# Patient Record
Sex: Male | Born: 1975 | Race: White | Hispanic: No | Marital: Married
Health system: Midwestern US, Community
[De-identification: ages and names within clinical notes are randomized; demographics above are authoritative.]

## PROBLEM LIST (undated history)

## (undated) DIAGNOSIS — E236 Other disorders of pituitary gland: Secondary | ICD-10-CM

## (undated) DIAGNOSIS — Z22322 Carrier or suspected carrier of Methicillin resistant Staphylococcus aureus: Secondary | ICD-10-CM

## (undated) DIAGNOSIS — M549 Dorsalgia, unspecified: Secondary | ICD-10-CM

## (undated) DIAGNOSIS — J189 Pneumonia, unspecified organism: Secondary | ICD-10-CM

## (undated) DIAGNOSIS — D352 Benign neoplasm of pituitary gland: Secondary | ICD-10-CM

## (undated) DIAGNOSIS — E291 Testicular hypofunction: Secondary | ICD-10-CM

## (undated) DIAGNOSIS — D497 Neoplasm of unspecified behavior of endocrine glands and other parts of nervous system: Secondary | ICD-10-CM

## (undated) HISTORY — PX: EYE SURGERY: SHX253

## (undated) HISTORY — PX: OTHER SURGICAL HISTORY: SHX169

## (undated) HISTORY — PX: KNEE SURGERY: SHX244

## (undated) HISTORY — PX: FOOT SURGERY: SHX648

## (undated) HISTORY — PX: BACK SURGERY: SHX140

---

## 2013-08-17 NOTE — Progress Notes (Signed)
HISTORY OF PRESENT ILLNESS  Richard Olsen is a 38 y.o. male.  HPI  Initial visit for hypogonadism - for a year     He takes gel , fortesta 40 mg a day and has not felt good as he says   He says his levels did not go higher   ( it was 208 on labs )  and he stopped using [place] ,  a month ago     He has 53 year old child and a newborn of 9 months old     He has extreme tired ness and loss of libido  He has gained weight quite a bit         Review of Systems   Constitutional: Positive for weight loss and malaise/fatigue.   HENT: Negative.    Eyes: Negative for pain and redness.   Respiratory: Negative.    Cardiovascular: Negative for chest pain, palpitations and leg swelling.   Gastrointestinal: Negative.  Negative for constipation.   Genitourinary: Negative.    Musculoskeletal: Negative for myalgias.   Skin: Negative.    Neurological: Negative.    Endo/Heme/Allergies: Negative.    Psychiatric/Behavioral: Negative for depression and memory loss. The patient does not have insomnia.        Physical Exam   Constitutional: He is oriented to person, place, and time. He appears well-developed and well-nourished.   HENT:   Head: Normocephalic.   Eyes: Conjunctivae and EOM are normal. Pupils are equal, round, and reactive to light.   Neck: Normal range of motion. Neck supple. No JVD present. No tracheal deviation present. No thyromegaly present.   Cardiovascular: Normal rate, regular rhythm and normal heart sounds.    Pulmonary/Chest: Effort normal and breath sounds normal.   Abdominal: Soft. Bowel sounds are normal.   Genitourinary: Penis normal.   Normal genital exam    Musculoskeletal: Normal range of motion.   Lymphadenopathy:     He has no cervical adenopathy.   Neurological: He is alert and oriented to person, place, and time. He has normal reflexes.   Skin: Skin is warm.   Psychiatric: He has a normal mood and affect.       Reviewed labs and scanned       ASSESSMENT and PLAN     1. Hypogonadism : will do complete profile after patient being off for atleast 6 weeks   This seems to be of recent onset as he fathered a child recently  After labs, will do MRI of pit gland   Rule out dys-metabolic syndrome    2. 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

## 2013-08-17 NOTE — Progress Notes (Signed)
Wt Readings from Last 3 Encounters:   08/17/13 281 lb (127.461 kg)     Temp Readings from Last 3 Encounters:   08/17/13 97.6 ??F (36.4 ??C) Oral     BP Readings from Last 3 Encounters:   08/17/13 132/70     Pulse Readings from Last 3 Encounters:   08/17/13 85

## 2013-09-03 LAB — TESTOSTERONE, FREE+TOTAL
Free testosterone (Direct): 4.2 pg/mL — ABNORMAL LOW (ref 8.7–25.1)
TESTOSTERONE, TOTAL, LC/MS: 225.9 ng/dL — ABNORMAL LOW (ref 348.0–1197.0)

## 2013-09-03 LAB — LIPID PANEL
Cholesterol, total: 201 mg/dL — ABNORMAL HIGH (ref 100–199)
HDL Cholesterol: 35 mg/dL — ABNORMAL LOW (ref >39)
Triglyceride: 409 mg/dL — ABNORMAL HIGH (ref 0–149)

## 2013-09-03 LAB — HEMOGLOBIN A1C WITH EAG: Hemoglobin A1c: 6 % — ABNORMAL HIGH (ref 4.8–5.6)

## 2013-09-03 LAB — CVD REPORT

## 2013-09-03 LAB — FOLLICLE STIMULATING HORMONE: FSH: 10.2 m[IU]/mL (ref 1.5–12.4)

## 2013-09-03 LAB — PROLACTIN: Prolactin: 7.7 ng/mL (ref 4.0–15.2)

## 2013-09-03 LAB — LUTEINIZING HORMONE: Luteinizing hormone: 7.2 m[IU]/mL (ref 1.7–8.6)

## 2013-09-03 LAB — TSH 3RD GENERATION: TSH: 1.48 u[IU]/mL (ref 0.450–4.500)

## 2013-09-06 NOTE — Patient Instructions (Addendum)
24 hour urine collection instructions  For free cortisol     On the day you plan to collect urine    1. Discard first urine sample soon after you get up    2. Start collecting urine samples in the container then on whole first day .Marland Kitchen...    3. until the first sample next day is collected into the jar.        Do not skip meals  Do not eat in between meals    Reduce carbs- pasta, rice, potatoes, bread   Do not drink juices or sodas  Donot eat peanut butter     Do not eat sugar free cookies and cakes         Start on fortesta 5 depressions on inner thighs, after the urine and labs ---

## 2013-09-06 NOTE — Progress Notes (Signed)
HISTORY OF PRESENT ILLNESS  Richard Olsen is a 38 y.o. male.  HPI  F/u after first visit for hypogonadism - in 3 weeks     He had labs as requested     He is suspecting of having cushings disease     He claims again that he had rapid weight gain in few months     He also has the [employer] med test         Prior history :    Has hypogonadism for  Year     He takes gel , fortesta 40 mg a day and has not felt good as he says   He says his levels did not go higher   ( it was 208 on labs )  and he stopped using [place] ,  a month ago     He has 12 year old child and a newborn of 54 months old     He has extreme tired ness and loss of libido  He has gained weight quite a bit         Review of Systems   Constitutional: Positive for weight loss and malaise/fatigue.   HENT: Negative.    Eyes: Negative for pain and redness.   Respiratory: Negative.    Cardiovascular: Negative for chest pain, palpitations and leg swelling.   Gastrointestinal: Negative.  Negative for constipation.   Genitourinary: Negative.    Musculoskeletal: Negative for myalgias.   Skin: Negative.    Neurological: Negative.    Endo/Heme/Allergies: Negative.    Psychiatric/Behavioral: Negative for depression and memory loss. The patient does not have insomnia.        Physical Exam   Constitutional: He is oriented to person, place, and time. He appears well-developed and well-nourished.   HENT:   Head: Normocephalic.   Eyes: Conjunctivae and EOM are normal. Pupils are equal, round, and reactive to light.   Neck: Normal range of motion. Neck supple. No JVD present. No tracheal deviation present. No thyromegaly present.   Cardiovascular: Normal rate, regular rhythm and normal heart sounds.    Pulmonary/Chest: Effort normal and breath sounds normal.   Abdominal: Soft. Bowel sounds are normal. very protrubarant belly  Genitourinary: Penis normal.   Normal genital exam    Musculoskeletal: Normal range of motion.   Lymphadenopathy:      He has no cervical adenopathy.   Neurological: He is alert and oriented to person, place, and time. He has normal reflexes.   Skin: Skin is warm.   Psychiatric: He has a normal mood and affect.       Reviewed labs with him      ASSESSMENT and PLAN    1. Hypogonadism : the levels are low   FSH and LH are in mid range   He has lot of [place] which he never used it correctly, asked him to start on  5 depressions a day and educated on proper application  This seems to be of recent onset as he fathered a child recently  will do MRI of pit gland     2.  dys-metabolic syndrome : prediabetes and dyslipidemia   Discussed TLC  Deferred start of any meds     3. On lexapro and wellbutrin for Social Anxiety disorder     Requesting repeat labs in a week, fasting   > 50 % of time is spent on counseling

## 2013-09-06 NOTE — Progress Notes (Signed)
Wt Readings from Last 3 Encounters:   09/06/13 286 lb (129.729 kg)   08/17/13 281 lb (127.461 kg)     Temp Readings from Last 3 Encounters:   09/06/13 97.6 ??F (36.4 ??C) Oral   08/17/13 97.6 ??F (36.4 ??C) Oral     BP Readings from Last 3 Encounters:   09/06/13 110/62   08/17/13 132/70     Pulse Readings from Last 3 Encounters:   09/06/13 80   08/17/13 85

## 2013-09-22 MED ORDER — SODIUM CHLORIDE 0.9 % IJ SYRG
Freq: Once | INTRAMUSCULAR | Status: AC
Start: 2013-09-22 — End: 2013-09-22
  Administered 2013-09-22: 16:00:00 via INTRAVENOUS

## 2013-09-22 MED ORDER — GADOBUTROL 15 MMOL/15 ML (1 MMOL/ML) IV
15 mmol/ mL (1 mmol/mL) | Freq: Once | INTRAVENOUS | Status: AC
Start: 2013-09-22 — End: 2013-09-22
  Administered 2013-09-22: 16:00:00 via INTRAVENOUS

## 2013-09-22 MED ORDER — SODIUM CHLORIDE 0.9% BOLUS IV
0.9 % | Freq: Once | INTRAVENOUS | Status: AC
Start: 2013-09-22 — End: 2013-09-22
  Administered 2013-09-22: 16:00:00 via INTRAVENOUS

## 2013-09-22 MED FILL — GADAVIST 15 MMOL/15 ML (1 MMOL/ML) INTRAVENOUS SOLUTION: 15 mmol/ mL (1 mmol/mL) | INTRAVENOUS | Qty: 15

## 2013-09-22 MED FILL — MONOJECT PREFILL ADVANCED 0.9 % SODIUM CHLORIDE INJECTION SYRINGE: INTRAMUSCULAR | Qty: 10

## 2013-09-22 MED FILL — SODIUM CHLORIDE 0.9 % IV: INTRAVENOUS | Qty: 75

## 2013-09-23 LAB — LUTEINIZING HORMONE: Luteinizing hormone: 4.5 m[IU]/mL (ref 1.7–8.6)

## 2013-09-23 LAB — TESTOSTERONE, FREE & TOTAL
Free testosterone (Direct): 6.3 pg/mL — ABNORMAL LOW (ref 8.7–25.1)
Testosterone: 194 ng/dL — ABNORMAL LOW (ref 348–1197)

## 2013-09-23 LAB — FOLLICLE STIMULATING HORMONE: FSH: 10.1 m[IU]/mL (ref 1.5–12.4)

## 2013-09-23 LAB — CORTISOL, URINE FREE 24 HR

## 2013-09-23 LAB — PSA, DIAGNOSTIC (PROSTATE SPECIFIC AG): Prostate Specific Ag: 1.1 ng/mL (ref 0.0–4.0)

## 2013-09-23 NOTE — Progress Notes (Signed)
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

## 2013-09-30 NOTE — Progress Notes (Signed)
Quick Note:        Left message    ______

## 2013-10-03 MED ORDER — TESTOSTERONE 10 MG/0.5 GRAM/ACTUATION TRANSDERMAL GEL PUMP
10 mg/0.5 gram /actuation | Freq: Every day | TRANSDERMAL | Status: DC
Start: 2013-10-03 — End: 2013-11-11

## 2013-10-03 NOTE — Telephone Encounter (Signed)
Patient says he is returning your call

## 2013-10-03 NOTE — Progress Notes (Signed)
Quick Note:        Informed pt of results. He stated an understanding and states he is already on fortesta. He has enough to last him until his next follow up appointment.    ______

## 2013-10-03 NOTE — Telephone Encounter (Signed)
-----   Message from Poydras sent at 09/30/2013  4:47 PM EDT -----  Regarding: Dr. Vennie Homans  Pt returning nurse call regarding his lab results, best number to reach pt [phone] please call pt this afternoon if possible.

## 2013-10-07 ENCOUNTER — Ambulatory Visit
Admit: 2013-10-07 | Discharge: 2013-10-07 | Payer: PRIVATE HEALTH INSURANCE | Attending: "Endocrinology | Primary: Internal Medicine

## 2013-10-07 DIAGNOSIS — D497 Neoplasm of unspecified behavior of endocrine glands and other parts of nervous system: Secondary | ICD-10-CM

## 2013-10-07 NOTE — Patient Instructions (Signed)
24 hour urine collection instructions    On the day you plan to collect urine    1. Discard first urine sample soon after you get up    2. Start collecting urine samples in the container then on whole first day .....    3. until the first sample next day is collected into the jar.

## 2013-10-07 NOTE — Progress Notes (Signed)
HISTORY OF PRESENT ILLNESS  Richard Olsen is a 38 y.o. male.  HPI  Second F/u after initial  visit for hypogonadism - in 4 weeks       He had MRI done and is here to discuss results     S/p had right foot plantar fasciitis       Prior history     He had labs as requested     He is suspecting of having cushings disease     He claims again that he had rapid weight gain in few months     He also has the [employer] med test         Prior history :    Has hypogonadism for  Year     He takes gel , fortesta 40 mg a day and has not felt good as he says   He says his levels did not go higher   ( it was 208 on labs )  and he stopped using [place] ,  a month ago     He has 9 year old child and a newborn of 21 months old     He has extreme tired ness and loss of libido  He has gained weight quite a bit         Review of Systems   Constitutional: Positive for weight loss and malaise/fatigue.   HENT: Negative.    Eyes: Negative for pain and redness.   Respiratory: Negative.    Cardiovascular: Negative for chest pain, palpitations and leg swelling.   Gastrointestinal: Negative.  Negative for constipation.   Genitourinary: Negative.    Musculoskeletal: Negative for myalgias.   Skin: Negative.    Neurological: Negative.    Endo/Heme/Allergies: Negative.    Psychiatric/Behavioral: Negative for depression and memory loss. The patient does not have insomnia.        Physical Exam   Constitutional: He is oriented to person, place, and time. He appears well-developed and well-nourished.   HENT:   Head: Normocephalic.   Eyes: Conjunctivae and EOM are normal. Pupils are equal, round, and reactive to light.   Neck: Normal range of motion. Neck supple. No JVD present. No tracheal deviation present. No thyromegaly present.   Cardiovascular: Normal rate, regular rhythm and normal heart sounds.    Pulmonary/Chest: Effort normal and breath sounds normal.   Abdominal: Soft. Bowel sounds are normal. very protrubarant belly   Genitourinary: Penis normal.   Normal genital exam    Musculoskeletal: Normal range of motion.   Lymphadenopathy:     He has no cervical adenopathy.   Neurological: He is alert and oriented to person, place, and time. He has normal reflexes.   Skin: Skin is warm.   Psychiatric: He has a normal mood and affect.       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 Hypogonadism :   FSH and LH are in mid range   He is on  5 depressions a day of fortesta   Will do pit labs , mainly for cushings and IGF-1 levels     2.  dys-metabolic syndrome : prediabetes and dyslipidemia   Discussed TLC  a1c 6 %  Will do statin and glucophage      3. 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

## 2013-10-07 NOTE — Progress Notes (Signed)
Wt Readings from Last 3 Encounters:   10/07/13 280 lb (127.007 kg)   09/22/13 267 lb (121.11 kg)   09/06/13 286 lb (129.729 kg)     Temp Readings from Last 3 Encounters:   10/07/13 97.7 ??F (36.5 ??C) Oral   09/06/13 97.6 ??F (36.4 ??C) Oral   08/17/13 97.6 ??F (36.4 ??C) Oral     BP Readings from Last 3 Encounters:   10/07/13 107/73   09/06/13 110/62   08/17/13 132/70     Pulse Readings from Last 3 Encounters:   10/07/13 76   09/06/13 80   08/17/13 85

## 2013-10-10 ENCOUNTER — Encounter: Admit: 2013-10-10 | Discharge: 2013-10-10 | Payer: PRIVATE HEALTH INSURANCE | Primary: Internal Medicine

## 2013-10-10 DIAGNOSIS — E291 Testicular hypofunction: Secondary | ICD-10-CM

## 2013-10-10 MED ORDER — METFORMIN SR 500 MG 24 HR TABLET
500 mg | ORAL_TABLET | Freq: Two times a day (BID) | ORAL | Status: DC
Start: 2013-10-10 — End: 2013-10-24

## 2013-10-15 LAB — METABOLIC PANEL, COMPREHENSIVE
A-G Ratio: 1.9 (ref 1.1–2.5)
ALT (SGPT): 43 IU/L (ref 0–44)
AST (SGOT): 25 IU/L (ref 0–40)
Albumin: 4.5 g/dL (ref 3.5–5.5)
Alk. phosphatase: 81 IU/L (ref 39–117)
BUN/Creatinine ratio: 13 (ref 8–19)
BUN: 11 mg/dL (ref 6–20)
Bilirubin, total: 0.2 mg/dL (ref 0.0–1.2)
CO2: 22 mmol/L (ref 18–29)
Calcium: 9.5 mg/dL (ref 8.7–10.2)
Chloride: 101 mmol/L (ref 97–108)
Creatinine: 0.84 mg/dL (ref 0.76–1.27)
GFR est AA: 130 mL/min/{1.73_m2} (ref >59)
GFR est non-AA: 113 mL/min/{1.73_m2} (ref >59)
GLOBULIN, TOTAL: 2.4 g/dL (ref 1.5–4.5)
Glucose: 92 mg/dL (ref 65–99)
Potassium: 4.2 mmol/L (ref 3.5–5.2)
Protein, total: 6.9 g/dL (ref 6.0–8.5)
Sodium: 141 mmol/L (ref 134–144)

## 2013-10-15 LAB — FOLLICLE STIMULATING HORMONE: FSH: 0.9 m[IU]/mL — ABNORMAL LOW (ref 1.5–12.4)

## 2013-10-15 LAB — LIPID PANEL
Cholesterol, total: 219 mg/dL — ABNORMAL HIGH (ref 100–199)
HDL Cholesterol: 33 mg/dL — ABNORMAL LOW (ref >39)
LDL, calculated: 113 mg/dL — ABNORMAL HIGH (ref 0–99)
Triglyceride: 363 mg/dL — ABNORMAL HIGH (ref 0–149)
VLDL, calculated: 73 mg/dL — ABNORMAL HIGH (ref 5–40)

## 2013-10-15 LAB — CORTISOL, URINE FREE 24 HR
Cortisol free, ug/24 hr: 15 ug/24 hr (ref 0–50)
Cortisol free, ug/L: 13 ug/L

## 2013-10-15 LAB — LUTEINIZING HORMONE: Luteinizing hormone: 0.4 m[IU]/mL — ABNORMAL LOW (ref 1.7–8.6)

## 2013-10-15 LAB — GROWTH HORMONE: Growth Hormone, Serum: <0.1 ng/mL (ref 0.0–10.0)

## 2013-10-15 LAB — T4, FREE: T4, Free: 1.05 ng/dL (ref 0.82–1.77)

## 2013-10-15 LAB — PROLACTIN: Prolactin: 3.7 ng/mL — ABNORMAL LOW (ref 4.0–15.2)

## 2013-10-15 LAB — TESTOSTERONE, FREE+TOTAL
Free testosterone (Direct): 9.3 pg/mL (ref 8.7–25.1)
TESTOSTERONE, TOTAL, LC/MS: 245.9 ng/dL — ABNORMAL LOW (ref 348.0–1197.0)

## 2013-10-15 LAB — ALPHA SUBUNIT (FREE): Alpha Subunit (Free): 0.15 ng/mL

## 2013-10-15 LAB — INSULIN-LIKE GROWTH FACTOR 1: Insulin-Like Growth Factor I: 150 ng/mL (ref 83–233)

## 2013-10-15 LAB — ACTH: ACTH, plasma: 26.9 pg/mL (ref 7.2–63.3)

## 2013-10-15 LAB — HEMOGLOBIN A1C WITH EAG: Hemoglobin A1c: 5.7 % — ABNORMAL HIGH (ref 4.8–5.6)

## 2013-10-15 LAB — TSH 3RD GENERATION: TSH: 0.736 u[IU]/mL (ref 0.450–4.500)

## 2013-10-15 LAB — CORTISOL: Cortisol, random: 11.4 ug/dL (ref 2.3–19.4)

## 2013-10-15 LAB — CVD REPORT

## 2013-10-19 NOTE — Progress Notes (Signed)
Quick Note:        Inform pt that all the labs have come back to be fine but for his TT ( known issue )    ______

## 2013-10-20 NOTE — Progress Notes (Signed)
Quick Note:        Left message    ______

## 2013-10-21 NOTE — Progress Notes (Signed)
Quick Note:        Informed pt of lab results. He stated an understanding    ______

## 2013-10-24 ENCOUNTER — Ambulatory Visit
Admit: 2013-10-24 | Discharge: 2013-10-24 | Payer: PRIVATE HEALTH INSURANCE | Attending: "Endocrinology | Primary: Internal Medicine

## 2013-10-24 DIAGNOSIS — D352 Benign neoplasm of pituitary gland: Secondary | ICD-10-CM

## 2013-10-24 MED ORDER — METFORMIN SR 500 MG 24 HR TABLET
500 mg | ORAL_TABLET | Freq: Every day | ORAL | Status: DC
Start: 2013-10-24 — End: 2014-09-13

## 2013-10-24 MED ORDER — FENOFIBRATE NANOCRYSTALLIZED 48 MG TAB
48 mg | ORAL_TABLET | Freq: Every day | ORAL | Status: DC
Start: 2013-10-24 — End: 2014-09-13

## 2013-10-24 NOTE — Progress Notes (Signed)
HISTORY OF PRESENT ILLNESS  Richard Olsen is a 38 y.o. male.  HPI  Second F/u after last  visit for pre diabetes , pituitary cyst and   hypogonadism - oct 2015      He had labs , fasting done again per my request   S/p had right foot plantar fasciitis     Gained 5 lbs ( 8 lbs to 10 lbs for uniform)  May be he lost 3- 5 lbs         Prior history     He had labs as requested     He is suspecting of having cushings disease     He claims again that he had rapid weight gain in few months     He also has the [employer] med test         Prior history :    Has hypogonadism for  Year     He takes gel , fortesta 40 mg a day and has not felt good as he says   He says his levels did not go higher   ( it was 208 on labs )  and he stopped using [place] ,  a month ago     He has 69 year old child and a newborn of 62 months old     He has extreme tired ness and loss of libido  He has gained weight quite a bit         Review of Systems   Constitutional: fatigue    HENT: Negative.    Eyes: Negative for pain and redness.   Respiratory: Negative.    Cardiovascular: Negative for chest pain, palpitations and leg swelling.   Gastrointestinal: Negative.  Negative for constipation.   Genitourinary: Negative.    Musculoskeletal: Negative for myalgias.   Skin: Negative.    Neurological: Negative.    Endo/Heme/Allergies: Negative.    Psychiatric/Behavioral: Negative for depression and memory loss. The patient does not have insomnia.        Physical Exam   Constitutional: He is oriented to person, place, and time. He appears well-developed and well-nourished.   HENT:   Head: Normocephalic.   Eyes: Conjunctivae and EOM are normal. Pupils are equal, round, and reactive to light.   Neck: Normal range of motion. Neck supple. No JVD present. No tracheal deviation present. No thyromegaly present.   Cardiovascular: Normal rate, regular rhythm and normal heart sounds.    Pulmonary/Chest: Effort normal and breath sounds normal.    Abdominal: Soft. Bowel sounds are normal. very protrubarant belly  Genitourinary: Penis normal.   Normal genital exam    Musculoskeletal: Normal range of motion.   Lymphadenopathy:     He has no cervical adenopathy.   Neurological: He is alert and oriented to person, place, and time. He has normal reflexes.   Skin: Skin is warm.   Psychiatric: He has a normal mood and affect.       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 Hypogonadism :   FSH and LH are in mid range   He is on  5 depressions a day of fortesta     2. Obesity  : Body mass index is 46.02 kg/(m^2).  Ruled out central cushings and growth hormone deficiency by labs   Pending salivary cortisol levels - 2 collections  Recommending him FDA approved weight loss meds   Or going in for bariatric surgery    3.  dys-metabolic syndrome : prediabetes and dyslipidemia   Discussed TLC  a1c is 5.7 %  Compared to  6 %  He got metformin ER 500 mg bid andhe is noticing some weight loss   By ACC/AHA calculator, he does not require the statin  Start on fibrate -tricor 54 mg     4. On lexapro and wellbutrin for Social Anxiety disorder     5. Sleep apnoea - on cpap    Requesting repeat labs in a week, fasting   > 50 % of time is spent on counseling

## 2013-10-24 NOTE — Patient Instructions (Addendum)
Increase metformin ER to 500 mg  4 pills a day with food       Start on Tricor 48 mg a day at night       continue on  Fortesta 5 pumps a day        Please collect salivary cortisol - 11 PM  ( random ) 2 times

## 2013-10-24 NOTE — Progress Notes (Signed)
Wt Readings from Last 3 Encounters:   10/24/13 285 lb (129.275 kg)   10/07/13 280 lb (127.007 kg)   09/22/13 267 lb (121.11 kg)     Temp Readings from Last 3 Encounters:   10/24/13 97.9 ??F (36.6 ??C) Oral   10/07/13 97.7 ??F (36.5 ??C) Oral   09/06/13 97.6 ??F (36.4 ??C) Oral     BP Readings from Last 3 Encounters:   10/24/13 130/66   10/07/13 107/73   09/06/13 110/62     Pulse Readings from Last 3 Encounters:   10/24/13 83   10/07/13 76   09/06/13 80

## 2013-11-08 ENCOUNTER — Encounter: Attending: "Endocrinology | Primary: Internal Medicine

## 2013-11-11 ENCOUNTER — Encounter

## 2013-11-11 MED ORDER — TESTOSTERONE 10 MG/0.5 GRAM/ACTUATION TRANSDERMAL GEL PUMP
10 mg/0.5 gram /actuation | Freq: Every day | TRANSDERMAL | Status: DC
Start: 2013-11-11 — End: 2014-09-13

## 2013-11-22 NOTE — Progress Notes (Signed)
Discussed with Dr. Richardo Priest at [phone] , [place]  Regarding patient's medical condition who has been working on patient's disability for back reasons

## 2014-01-13 ENCOUNTER — Encounter: Primary: Internal Medicine

## 2014-01-13 ENCOUNTER — Encounter: Admit: 2014-01-13 | Discharge: 2014-01-13 | Payer: PRIVATE HEALTH INSURANCE | Primary: Internal Medicine

## 2014-01-17 LAB — TESTOSTERONE, FREE+TOTAL
Free testosterone (Direct): 15.7 pg/mL (ref 8.7–25.1)
TESTOSTERONE, TOTAL, LC/MS: 426.3 ng/dL (ref 348.0–1197.0)

## 2014-01-17 LAB — CBC WITH AUTOMATED DIFF
ABS. BASOPHILS: 0.1 10*3/uL (ref 0.0–0.2)
ABS. EOSINOPHILS: 0.3 10*3/uL (ref 0.0–0.4)
ABS. IMM. GRANS.: 0 10*3/uL (ref 0.0–0.1)
ABS. MONOCYTES: 0.8 10*3/uL (ref 0.1–0.9)
ABS. NEUTROPHILS: 4.2 10*3/uL (ref 1.4–7.0)
Abs Lymphocytes: 3.3 10*3/uL — ABNORMAL HIGH (ref 0.7–3.1)
BASOPHILS: 1 %
EOSINOPHILS: 4 %
HCT: 45.4 % (ref 37.5–51.0)
HGB: 15.1 g/dL (ref 12.6–17.7)
IMMATURE GRANULOCYTES: 0 %
Lymphocytes: 38 %
MCH: 28.8 pg (ref 26.6–33.0)
MCHC: 33.3 g/dL (ref 31.5–35.7)
MCV: 87 fL (ref 79–97)
MONOCYTES: 10 %
NEUTROPHILS: 47 %
PLATELET: 274 10*3/uL (ref 150–379)
RBC: 5.24 x10E6/uL (ref 4.14–5.80)
RDW: 13.8 % (ref 12.3–15.4)
WBC: 8.8 10*3/uL (ref 3.4–10.8)

## 2014-01-17 LAB — HEMOGLOBIN A1C WITH EAG
Estimated average glucose: 120 mg/dL
Hemoglobin A1c: 5.8 % — ABNORMAL HIGH (ref 4.8–5.6)

## 2014-01-17 LAB — LIPID PANEL
Cholesterol, total: 205 mg/dL — ABNORMAL HIGH (ref 100–199)
HDL Cholesterol: 39 mg/dL — ABNORMAL LOW (ref >39)
LDL, calculated: 110 mg/dL — ABNORMAL HIGH (ref 0–99)
Triglyceride: 280 mg/dL — ABNORMAL HIGH (ref 0–149)
VLDL, calculated: 56 mg/dL — ABNORMAL HIGH (ref 5–40)

## 2014-01-17 LAB — FOLLICLE STIMULATING HORMONE: FSH: 2 m[IU]/mL (ref 1.5–12.4)

## 2014-01-17 LAB — CVD REPORT

## 2014-01-17 LAB — LUTEINIZING HORMONE: Luteinizing hormone: 0.3 m[IU]/mL — ABNORMAL LOW (ref 1.7–8.6)

## 2014-01-20 ENCOUNTER — Encounter: Attending: "Endocrinology | Primary: Internal Medicine

## 2014-01-27 ENCOUNTER — Encounter: Primary: Internal Medicine

## 2014-08-22 ENCOUNTER — Encounter

## 2014-08-22 ENCOUNTER — Ambulatory Visit
Admit: 2014-08-23 | Discharge: 2014-08-23 | Payer: PRIVATE HEALTH INSURANCE | Attending: "Endocrinology | Primary: Internal Medicine

## 2014-08-22 DIAGNOSIS — D497 Neoplasm of unspecified behavior of endocrine glands and other parts of nervous system: Secondary | ICD-10-CM

## 2014-08-22 NOTE — Patient Instructions (Addendum)
Do not give any prescriptions today - Aug 22 2014     metformin ER to 500 mg  4 pills a day with food       Stay on Tricor 48 mg a day at night       Testosterone  gel 5 depressions a day

## 2014-08-22 NOTE — Progress Notes (Signed)
HISTORY OF PRESENT ILLNESS  Richard Olsen is a 39 y.o. male.  HPI   F/u after last  visit for pre diabetes , pituitary cyst and   hypogonadism - oct 2015    He had referral issues     He has 2 kids with him     Lost 17  lbs ( not in Uniform )    He mentions that his pcp took him off tricor as well as metformin saying he does not need either   He has been off TT       Prior history     He had labs as requested     He is suspecting of having cushings disease     He claims again that he had rapid weight gain in few months     He also has the [employer] med test         Prior history :    Has hypogonadism for  Year     He takes gel , fortesta 40 mg a day and has not felt good as he says   He says his levels did not go higher   ( it was 208 on labs )  and he stopped using [place] ,  a month ago     He has 39 year old child and a newborn of 44 months old     He has extreme tired ness and loss of libido  He has gained weight quite a bit         Review of Systems   Constitutional: fatigue    HENT: Negative.    Eyes: Negative for pain and redness.   Respiratory: Negative.    Cardiovascular: Negative for chest pain, palpitations and leg swelling.   Gastrointestinal: Negative.  Negative for constipation.   Genitourinary: Negative.    Musculoskeletal: Negative for myalgias.   Skin: Negative.    Neurological: Negative.    Endo/Heme/Allergies: Negative.    Psychiatric/Behavioral: Negative for depression and memory loss. The patient does not have insomnia.        Physical Exam   Constitutional: He is oriented to person, place, and time. He appears well-developed and well-nourished.   HENT:   Head: Normocephalic.   Eyes: Conjunctivae and EOM are normal. Pupils are equal, round, and reactive to light.   Neck: Normal range of motion. Neck supple. No JVD present. No tracheal deviation present. No thyromegaly present.   Cardiovascular: Normal rate, regular rhythm and normal heart sounds.     Pulmonary/Chest: Effort normal and breath sounds normal.   Abdominal: Soft. Bowel sounds are normal. very protrubarant belly  Genitourinary: Penis normal.   Normal genital exam    Musculoskeletal: Normal range of motion.   Lymphadenopathy:     He has no cervical adenopathy.   Neurological: He is alert and oriented to person, place, and time. He has normal reflexes.   Skin: Skin is warm.   Psychiatric: He has a normal mood and affect.       Labs to be obtained from April 2016       ASSESSMENT and PLAN    1. Pituitary tumor /cyst :  8 mm posterior cyst from MRI sept 2015   Will do a repeat sept 2016     A. Hypogonadism :   FSH and LH are in mid range   He is off    fortesta since feb 2016     2. Obesity  : Body mass index is  43.26 kg/(m^2).  Ruled out central cushings and growth hormone deficiency by labs   salivary cortisol levels - 2 collections     Recommending him FDA approved weight loss meds   Or going in for bariatric surgery    3.  dys-metabolic syndrome : prediabetes and dyslipidemia   Discussed TLC  a1c is 5.8 %  Compared to  6 %   He got metformin ER 500 mg bid and he is noticing some weight loss   By ACC/AHA calculator, he does not require the statin  Started on fibrate -tricor 54 mg in nov 2015   He has been off TRICOR as recommended by his pcp and await all labs he had from April 2016     4. On lunesta  and wellbutrin for Social Anxiety disorder , ADHD and PTSD    5. Sleep apnoea - on cpap  I do nto want him to start on any meds until April 2016 labs are seen and compared to jan 2016 labs   Requesting repeat labs in a week, fasting   > 50 % of time is spent on counseling

## 2014-08-22 NOTE — Progress Notes (Signed)
Wt Readings from Last 3 Encounters:   08/22/14 268 lb (121.6 kg)   10/24/13 285 lb (129.3 kg)   10/07/13 280 lb (127 kg)     Temp Readings from Last 3 Encounters:   08/22/14 97.2 ??F (36.2 ??C) (Oral)   10/24/13 97.9 ??F (36.6 ??C) (Oral)   10/07/13 97.7 ??F (36.5 ??C) (Oral)     BP Readings from Last 3 Encounters:   08/22/14 103/65   10/24/13 130/66   10/07/13 107/73     Pulse Readings from Last 3 Encounters:   08/22/14 80   10/24/13 83   10/07/13 76     Lab Results   Component Value Date/Time    HEMOGLOBIN A1C 5.8 01/13/2014 01:25 PM

## 2014-09-05 ENCOUNTER — Inpatient Hospital Stay: Admit: 2014-09-05 | Payer: TRICARE (CHAMPUS) | Attending: "Endocrinology | Primary: Internal Medicine

## 2014-09-05 DIAGNOSIS — D497 Neoplasm of unspecified behavior of endocrine glands and other parts of nervous system: Secondary | ICD-10-CM

## 2014-09-05 MED ORDER — GADOBUTROL 10 MMOL/10 ML (1 MMOL/ML) IV
10 mmol/ mL (1 mmol/mL) | Freq: Once | INTRAVENOUS | Status: AC
Start: 2014-09-05 — End: 2014-09-05
  Administered 2014-09-05: 17:00:00 via INTRAVENOUS

## 2014-09-05 MED FILL — GADOBUTROL 10 MMOL/10 ML (1 MMOL/ML) IV: 10 mmol/ mL (1 mmol/mL) | INTRAVENOUS | Qty: 20

## 2014-09-05 NOTE — Progress Notes (Signed)
Informed pt of MRI results. He stated an understanding

## 2014-09-07 NOTE — Progress Notes (Signed)
Inform pt that the pituitary tumor reached a size of 1 cm , almost same size as before     Will talk about the care at next visit

## 2014-09-13 ENCOUNTER — Encounter

## 2014-09-13 MED ORDER — FENOFIBRATE NANOCRYSTALLIZED 48 MG TAB
48 mg | ORAL_TABLET | Freq: Every day | ORAL | 6 refills | Status: DC
Start: 2014-09-13 — End: 2015-05-11

## 2014-09-13 MED ORDER — TESTOSTERONE 10 MG/0.5 GRAM/ACTUATION TRANSDERMAL GEL PUMP
10 mg/0.5 gram /actuation | Freq: Every day | TRANSDERMAL | 6 refills | Status: DC
Start: 2014-09-13 — End: 2014-09-26

## 2014-09-13 MED ORDER — METFORMIN SR 500 MG 24 HR TABLET
500 mg | ORAL_TABLET | Freq: Every day | ORAL | 6 refills | Status: DC
Start: 2014-09-13 — End: 2014-09-26

## 2014-09-26 ENCOUNTER — Ambulatory Visit
Admit: 2014-09-26 | Discharge: 2014-09-26 | Payer: PRIVATE HEALTH INSURANCE | Attending: "Endocrinology | Primary: Internal Medicine

## 2014-09-26 ENCOUNTER — Encounter

## 2014-09-26 DIAGNOSIS — D497 Neoplasm of unspecified behavior of endocrine glands and other parts of nervous system: Secondary | ICD-10-CM

## 2014-09-26 MED ORDER — TESTOSTERONE 10 MG/0.5 GRAM/ACTUATION TRANSDERMAL GEL PUMP
10 mg/0.5 gram /actuation | Freq: Every day | TRANSDERMAL | 6 refills | Status: DC
Start: 2014-09-26 — End: 2014-11-14

## 2014-09-26 MED ORDER — METFORMIN SR 500 MG 24 HR TABLET
500 mg | ORAL_TABLET | Freq: Two times a day (BID) | ORAL | 6 refills | Status: DC
Start: 2014-09-26 — End: 2015-05-11

## 2014-09-26 NOTE — Patient Instructions (Addendum)
metformin ER to 500 mg 2  Pill  Twice a day with food       Stay on Tricor 48 mg a day at night       Decrease Testosterone  gel 4 depressions a day

## 2014-09-26 NOTE — Progress Notes (Signed)
HISTORY OF PRESENT ILLNESS  Richard Olsen is a 39 y.o. male.  HPI   F/u after last  visit for pre diabetes , pituitary cyst and   hypogonadism - aug 2016       He got started on meds incl TT     Gained 18 lbs ( exactly he lost 17 lbs since stopping TT )  He is taking 6 depressions a day instead of 5 written out by me      He is c/o diarrhea from metformin ER   His BP went up higher too   He mentions that his pcp and him had mis communication and he is back on to meds     Prior history     He had labs as requested     He is suspecting of having cushings disease     He claims again that he had rapid weight gain in few months     He also has the [employer] med test         Prior history :    Has hypogonadism for  Year     He takes gel , fortesta 40 mg a day and has not felt good as he says   He says his levels did not go higher   ( it was 208 on labs )  and he stopped using [place] ,  a month ago     He has 63 year old child and a newborn of 65 months old     He has extreme tired ness and loss of libido  He has gained weight quite a bit         Review of Systems   Constitutional: fatigue    HENT: Negative.    Eyes: Negative for pain and redness.   Respiratory: Negative.    Cardiovascular: Negative for chest pain, palpitations and leg swelling.   Gastrointestinal: Negative.  Negative for constipation.   Genitourinary: Negative.    Musculoskeletal: Negative for myalgias.   Skin: Negative.    Neurological: Negative.    Endo/Heme/Allergies: Negative.    Psychiatric/Behavioral: Negative for depression and memory loss. The patient does not have insomnia.        Physical Exam   Constitutional: He is oriented to person, place, and time. He appears well-developed and well-nourished.   HENT:   Head: Normocephalic.   Eyes: Conjunctivae and EOM are normal. Pupils are equal, round, and reactive to light.   Neck: Normal range of motion. Neck supple. No JVD present. No tracheal deviation present. No thyromegaly present.    Cardiovascular: Normal rate, regular rhythm and normal heart sounds.    Pulmonary/Chest: Effort normal and breath sounds normal.   Abdominal: Soft. Bowel sounds are normal. very protrubarant belly  Genitourinary: Penis normal.   Normal genital exam    Musculoskeletal: Normal range of motion.   Lymphadenopathy:     He has no cervical adenopathy.   Neurological: He is alert and oriented to person, place, and time. He has normal reflexes.   Skin: Skin is warm.   Psychiatric: He has a normal mood and affect.       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 Hypogonadism :   FSH and LH are in mid range   He is off    fortesta since feb 2016 and resumed it in aug 2016   Within a month of starting it , gained 17 lbs   Will reduce the dose to 4 depressions a day as he took 6 depressions a day on his own despite written instructions       2. Obesity  : Body mass index is 44.87 kg/(m^2).  Ruled out central cushings and growth hormone deficiency by labs   salivary cortisol levels - 2 collections - normal   Educated him about weight gain from TT use     3.  dys-metabolic syndrome : prediabetes and dyslipidemia   Discussed TLC  a1c is 5.8 %  Compared to  6 %   He got metformin ER 500 mg bid and he is noticing some weight loss   By ACC/AHA calculator, he does not require the statin  Started on fibrate -tricor 54 mg in nov 2015 ,   He has been off [employer] gap with  his pcp and resumed it back  177, 224,  31, 101   He resumed TRICOR     4. On lunesta  and wellbutrin for Social Anxiety disorder , ADHD and PTSD  He is staying on lexapro and discontinued on welbutrin   lexapro - appetite stimulating   Asking him to go to welbutrin being weight neutral if need be     5. Sleep apnoea - on cpap      > 50 % of time is spent on counseling

## 2014-09-26 NOTE — Progress Notes (Signed)
Wt Readings from Last 3 Encounters:   09/26/14 278 lb (126.1 kg)   09/05/14 260 lb (117.9 kg)   08/22/14 268 lb (121.6 kg)     Temp Readings from Last 3 Encounters:   09/26/14 97.4 ??F (36.3 ??C) (Oral)   08/22/14 97.2 ??F (36.2 ??C) (Oral)   10/24/13 97.9 ??F (36.6 ??C) (Oral)     BP Readings from Last 3 Encounters:   09/26/14 125/89   08/22/14 103/65   10/24/13 130/66     Pulse Readings from Last 3 Encounters:   09/26/14 74   08/22/14 80   10/24/13 83

## 2014-10-06 ENCOUNTER — Encounter: Admit: 2014-10-06 | Discharge: 2014-10-06 | Payer: PRIVATE HEALTH INSURANCE | Primary: Internal Medicine

## 2014-10-06 NOTE — Addendum Note (Signed)
Addended by: Jeanette Caprice on: 10/06/2014 08:30 AM      Modules accepted: Orders

## 2014-10-12 LAB — CBC WITH AUTOMATED DIFF
ABS. BASOPHILS: 0.1 10*3/uL (ref 0.0–0.2)
ABS. EOSINOPHILS: 0.4 10*3/uL (ref 0.0–0.4)
ABS. IMM. GRANS.: 0 10*3/uL (ref 0.0–0.1)
ABS. MONOCYTES: 0.7 10*3/uL (ref 0.1–0.9)
ABS. NEUTROPHILS: 3.8 10*3/uL (ref 1.4–7.0)
Abs Lymphocytes: 2.7 10*3/uL (ref 0.7–3.1)
BASOPHILS: 1 %
EOSINOPHILS: 5 %
HCT: 42.2 % (ref 37.5–51.0)
HGB: 14.4 g/dL (ref 12.6–17.7)
IMMATURE GRANULOCYTES: 1 %
Lymphocytes: 35 %
MCH: 29 pg (ref 26.6–33.0)
MCHC: 34.1 g/dL (ref 31.5–35.7)
MCV: 85 fL (ref 79–97)
MONOCYTES: 9 %
NEUTROPHILS: 49 %
PLATELET: 231 10*3/uL (ref 150–379)
RBC: 4.97 x10E6/uL (ref 4.14–5.80)
RDW: 14.4 % (ref 12.3–15.4)
WBC: 7.6 10*3/uL (ref 3.4–10.8)

## 2014-10-12 LAB — LUTEINIZING HORMONE: Luteinizing hormone: 3.2 m[IU]/mL (ref 1.7–8.6)

## 2014-10-12 LAB — TESTOSTERONE, FREE+TOTAL
Free testosterone (Direct): 6.9 pg/mL — ABNORMAL LOW (ref 8.7–25.1)
TESTOSTERONE, TOTAL, LC/MS: 249.8 ng/dL — ABNORMAL LOW (ref 348.0–1197.0)

## 2014-10-12 LAB — FOLLICLE STIMULATING HORMONE: FSH: 7.6 m[IU]/mL (ref 1.5–12.4)

## 2014-10-12 LAB — PSA, DIAGNOSTIC (PROSTATE SPECIFIC AG): Prostate Specific Ag: 0.7 ng/mL (ref 0.0–4.0)

## 2014-11-13 ENCOUNTER — Telehealth

## 2014-11-13 NOTE — Telephone Encounter (Signed)
Patient needs Testosterone RX sent to [facility]  [facility]

## 2014-11-14 MED ORDER — TESTOSTERONE 10 MG/0.5 GRAM/ACTUATION TRANSDERMAL GEL PUMP
10 mg/0.5 gram /actuation | Freq: Every day | TRANSDERMAL | 5 refills | Status: DC
Start: 2014-11-14 — End: 2015-01-26

## 2015-01-09 ENCOUNTER — Telehealth

## 2015-01-09 NOTE — Telephone Encounter (Signed)
-----   Message from Estella Husk, MD sent at 01/05/2015  9:16 PM EST -----  Cbc with diff, total and free tt, psa  And FSH and LH, lipid profile       ----- Message -----     From: Minette Headland, LPN     Sent: [id number]  12:26 PM       To: Estella Husk, MD    Which labs to be drawn ??

## 2015-01-09 NOTE — Telephone Encounter (Signed)
Order placed for pt,  per Verbal Order with read back from Dr Shellia Cleverly on 01/09/2015.

## 2015-01-12 ENCOUNTER — Encounter: Admit: 2015-01-12 | Discharge: 2015-01-12 | Payer: PRIVATE HEALTH INSURANCE | Primary: Internal Medicine

## 2015-01-12 DIAGNOSIS — E291 Testicular hypofunction: Secondary | ICD-10-CM

## 2015-01-15 ENCOUNTER — Encounter: Attending: "Endocrinology | Primary: Internal Medicine

## 2015-01-16 LAB — LIPID PANEL
Cholesterol, total: 197 mg/dL (ref 100–199)
HDL Cholesterol: 36 mg/dL — ABNORMAL LOW (ref >39)
LDL, calculated: 122 mg/dL — ABNORMAL HIGH (ref 0–99)
Triglyceride: 193 mg/dL — ABNORMAL HIGH (ref 0–149)
VLDL, calculated: 39 mg/dL (ref 5–40)

## 2015-01-16 LAB — CBC WITH AUTOMATED DIFF
ABS. BASOPHILS: 0.1 10*3/uL (ref 0.0–0.2)
ABS. EOSINOPHILS: 0.3 10*3/uL (ref 0.0–0.4)
ABS. IMM. GRANS.: 0 10*3/uL (ref 0.0–0.1)
ABS. MONOCYTES: 0.8 10*3/uL (ref 0.1–0.9)
ABS. NEUTROPHILS: 4.4 10*3/uL (ref 1.4–7.0)
Abs Lymphocytes: 3 10*3/uL (ref 0.7–3.1)
BASOPHILS: 1 %
EOSINOPHILS: 3 %
HCT: 43.2 % (ref 37.5–51.0)
HGB: 14.9 g/dL (ref 12.6–17.7)
IMMATURE GRANULOCYTES: 1 %
Lymphocytes: 35 %
MCH: 28.8 pg (ref 26.6–33.0)
MCHC: 34.5 g/dL (ref 31.5–35.7)
MCV: 84 fL (ref 79–97)
MONOCYTES: 9 %
NEUTROPHILS: 51 %
PLATELET: 254 10*3/uL (ref 150–379)
RBC: 5.17 x10E6/uL (ref 4.14–5.80)
RDW: 13.8 % (ref 12.3–15.4)
WBC: 8.5 10*3/uL (ref 3.4–10.8)

## 2015-01-16 LAB — TESTOSTERONE, FREE+TOTAL
Free testosterone (Direct): 9.4 pg/mL (ref 8.7–25.1)
TESTOSTERONE, TOTAL, LC/MS: 288.4 ng/dL — ABNORMAL LOW (ref 348.0–1197.0)

## 2015-01-16 LAB — PSA, DIAGNOSTIC (PROSTATE SPECIFIC AG): Prostate Specific Ag: 0.9 ng/mL (ref 0.0–4.0)

## 2015-01-16 LAB — FOLLICLE STIMULATING HORMONE: FSH: 7 m[IU]/mL (ref 1.5–12.4)

## 2015-01-16 LAB — LUTEINIZING HORMONE: Luteinizing hormone: 4.9 m[IU]/mL (ref 1.7–8.6)

## 2015-01-16 LAB — CVD REPORT

## 2015-01-26 ENCOUNTER — Ambulatory Visit
Admit: 2015-01-26 | Discharge: 2015-01-26 | Payer: PRIVATE HEALTH INSURANCE | Attending: "Endocrinology | Primary: Internal Medicine

## 2015-01-26 ENCOUNTER — Encounter

## 2015-01-26 DIAGNOSIS — E291 Testicular hypofunction: Secondary | ICD-10-CM

## 2015-01-26 MED ORDER — BUPROPION XL 150 MG 24 HR TAB
150 mg | ORAL_TABLET | Freq: Every evening | ORAL | 5 refills | Status: DC
Start: 2015-01-26 — End: 2015-05-11

## 2015-01-26 MED ORDER — TESTOSTERONE 10 MG/0.5 GRAM/ACTUATION TRANSDERMAL GEL PUMP
10 mg/0.5 gram /actuation | Freq: Every day | TRANSDERMAL | 5 refills | Status: DC
Start: 2015-01-26 — End: 2015-05-11

## 2015-01-26 NOTE — Progress Notes (Signed)
Wt Readings from Last 3 Encounters:   01/26/15 291 lb (132 kg)   09/26/14 278 lb (126.1 kg)   09/05/14 260 lb (117.9 kg)     Temp Readings from Last 3 Encounters:   01/26/15 97.1 ??F (36.2 ??C) (Oral)   09/26/14 97.4 ??F (36.3 ??C) (Oral)   08/22/14 97.2 ??F (36.2 ??C) (Oral)     BP Readings from Last 3 Encounters:   01/26/15 (!) 122/91   09/26/14 125/89   08/22/14 103/65     Pulse Readings from Last 3 Encounters:   01/26/15 80   09/26/14 74   08/22/14 80     Lab Results   Component Value Date/Time    Hemoglobin A1c 5.8 01/13/2014 01:25 PM

## 2015-01-26 NOTE — Progress Notes (Signed)
HISTORY OF PRESENT ILLNESS  Richard Olsen is a 40 y.o. male.  HPI   F/u after last  visit for pre diabetes , pituitary hypogonadism - Oct 2016       Gained  13 lbs ( oct 2016 )  He says he is not taking TT as he felt the rages at home   He is taking it intermittently    He stopped all his anti anxiety/anti depressant meds too   He quit smoking he says 46 days ago    Prior history     He had labs as requested     He is suspecting of having cushings disease     He claims again that he had rapid weight gain in few months     He also has the [employer] med test         Prior history :    Has hypogonadism for  Year     He takes gel , fortesta 40 mg a day and has not felt good as he says   He says his levels did not go higher   ( it was 208 on labs )  and he stopped using [place] ,  a month ago     He has 71 year old child and a newborn of 48 months old     He has extreme tired ness and loss of libido  He has gained weight quite a bit         Review of Systems   Constitutional: fatigue    HENT: Negative.    Eyes: Negative for pain and redness.   Respiratory: Negative.    Cardiovascular: Negative for chest pain, palpitations and leg swelling.   Gastrointestinal: Negative.  Negative for constipation.   Genitourinary: Negative.    Musculoskeletal: Negative for myalgias.   Skin: Negative.    Neurological: Negative.    Endo/Heme/Allergies: Negative.    Psychiatric/Behavioral: Negative for depression and memory loss. The patient does not have insomnia.        Physical Exam   Constitutional: He is oriented to person, place, and time. He appears well-developed and well-nourished.   HENT:   Head: Normocephalic.   Eyes: Conjunctivae and EOM are normal. Pupils are equal, round, and reactive to light.   Neck: Normal range of motion. Neck supple. No JVD present. No tracheal deviation present. No thyromegaly present.   Cardiovascular: Normal rate, regular rhythm and normal heart sounds.     Pulmonary/Chest: Effort normal and breath sounds normal.   Abdominal: Soft. Bowel sounds are normal. very protrubarant belly  Genitourinary: Penis normal.   Normal genital exam    Musculoskeletal: Normal range of motion.   Lymphadenopathy:     He has no cervical adenopathy.   Neurological: He is alert and oriented to person, place, and time. He has normal reflexes.   Skin: Skin is warm.   Psychiatric: He has a normal mood and affect.       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 Hypogonadism :   FSH and LH are in mid range   He is off    fortesta since feb 2016 and resumed it in aug 2016   Within  a month of starting it , gained 17 lbs and again another 13 lbs of weight gain in oct 2016   reduced  the dose to 4 depressions a day as he took 6 depressions a day on his own despite written instructions   Asking him to not use it intermittently   Explained the rationale behind his weight gain and rages   Asked him to  cut down to 2 depressions a day   TT is an optional medication, so there is no compulsion to be on it       2. Obesity  : Body mass index is 46.97 kg/(m^2).  Ruled out central cushings and growth hormone deficiency by labs   salivary cortisol levels - 2 collections - normal   Educated him about weight gain from TT use at higher doses   Will keep him on low doses,     3.  dys-metabolic syndrome : prediabetes and dyslipidemia   Discussed TLC  a1c is 5.8 %  Compared to  6 %   He got metformin ER 500 mg 4 pills  bid   By ACC/AHA calculator, he does not require the statin  on fibrate -tricor 48 mg - TG are good       Obesity : Body mass index is 46.97 kg/(m^2).  Gained 30 lbs  Since starting TT  Intermittent use, inadervertant use are the causes for weight gain   Along with quitting smoking  Hoping he will more cautious in its use       4. Off lunesta  and wellbutrin for Social Anxiety disorder , ADHD and PTSD  He is staying on lexapro and discontinued on welbutrin   lexapro - appetite stimulating and he says he is off all meds   Asking him to go to welbutrin for few months, before deciding on change of meds from new psychiartrist     5. Sleep apnoea - on cpap    6. Quit smoking on chantix - 46 days ago       > 50 % of time is spent on counseling

## 2015-01-26 NOTE — Patient Instructions (Signed)
Start on wellbutrin xr 150 mg at night     metformin ER to 500 mg 2  Pill  Twice a day with food       Stay on Tricor 48 mg a day at night       Decrease Testosterone  gel 2 depressions a day

## 2015-01-27 DIAGNOSIS — E291 Testicular hypofunction: Secondary | ICD-10-CM

## 2015-04-27 ENCOUNTER — Encounter

## 2015-05-04 ENCOUNTER — Encounter: Admit: 2015-05-04 | Discharge: 2015-05-04 | Payer: PRIVATE HEALTH INSURANCE | Primary: Internal Medicine

## 2015-05-04 DIAGNOSIS — E291 Testicular hypofunction: Secondary | ICD-10-CM

## 2015-05-06 LAB — CBC WITH AUTOMATED DIFF
ABS. BASOPHILS: 0.1 10*3/uL (ref 0.0–0.2)
ABS. EOSINOPHILS: 0.2 10*3/uL (ref 0.0–0.4)
ABS. IMM. GRANS.: 0.1 10*3/uL (ref 0.0–0.1)
ABS. MONOCYTES: 0.6 10*3/uL (ref 0.1–0.9)
ABS. NEUTROPHILS: 4 10*3/uL (ref 1.4–7.0)
Abs Lymphocytes: 2.7 10*3/uL (ref 0.7–3.1)
BASOPHILS: 1 %
EOSINOPHILS: 3 %
HCT: 42.5 % (ref 37.5–51.0)
HGB: 14.3 g/dL (ref 12.6–17.7)
IMMATURE GRANULOCYTES: 1 %
Lymphocytes: 36 %
MCH: 28.3 pg (ref 26.6–33.0)
MCHC: 33.6 g/dL (ref 31.5–35.7)
MCV: 84 fL (ref 79–97)
MONOCYTES: 8 %
NEUTROPHILS: 51 %
PLATELET: 242 10*3/uL (ref 150–379)
RBC: 5.05 x10E6/uL (ref 4.14–5.80)
RDW: 14.1 % (ref 12.3–15.4)
WBC: 7.6 10*3/uL (ref 3.4–10.8)

## 2015-05-06 LAB — TESTOSTERONE, FREE & TOTAL
Free testosterone (Direct): 3.8 pg/mL — ABNORMAL LOW (ref 8.7–25.1)
Testosterone: 66 ng/dL — ABNORMAL LOW (ref 348–1197)

## 2015-05-06 LAB — LIPID PANEL
Cholesterol, total: 186 mg/dL (ref 100–199)
HDL Cholesterol: 42 mg/dL (ref >39)
LDL, calculated: 109 mg/dL — ABNORMAL HIGH (ref 0–99)
Triglyceride: 176 mg/dL — ABNORMAL HIGH (ref 0–149)
VLDL, calculated: 35 mg/dL (ref 5–40)

## 2015-05-06 LAB — HEMOGLOBIN A1C WITH EAG
Estimated average glucose: 123 mg/dL
Hemoglobin A1c: 5.9 % — ABNORMAL HIGH (ref 4.8–5.6)

## 2015-05-06 LAB — FOLLICLE STIMULATING HORMONE: FSH: 4.6 m[IU]/mL (ref 1.5–12.4)

## 2015-05-06 LAB — TSH 3RD GENERATION: TSH: 1.59 u[IU]/mL (ref 0.450–4.500)

## 2015-05-06 LAB — LUTEINIZING HORMONE: Luteinizing hormone: 2.1 m[IU]/mL (ref 1.7–8.6)

## 2015-05-06 LAB — CVD REPORT

## 2015-05-11 ENCOUNTER — Encounter

## 2015-05-11 ENCOUNTER — Ambulatory Visit
Admit: 2015-05-11 | Discharge: 2015-05-11 | Payer: PRIVATE HEALTH INSURANCE | Attending: "Endocrinology | Primary: Internal Medicine

## 2015-05-11 DIAGNOSIS — D497 Neoplasm of unspecified behavior of endocrine glands and other parts of nervous system: Secondary | ICD-10-CM

## 2015-05-11 MED ORDER — BUPROPION XL 150 MG 24 HR TAB
150 mg | ORAL_TABLET | Freq: Every evening | ORAL | 5 refills | Status: DC
Start: 2015-05-11 — End: 2015-09-21

## 2015-05-11 MED ORDER — LORCASERIN ER 20 MG TABLET,EXTENDED RELEASE 24 HR
20 mg | ORAL_TABLET | Freq: Every day | ORAL | 4 refills | Status: DC
Start: 2015-05-11 — End: 2016-03-07

## 2015-05-11 MED ORDER — METFORMIN SR 500 MG 24 HR TABLET
500 mg | ORAL_TABLET | Freq: Two times a day (BID) | ORAL | 6 refills | Status: DC
Start: 2015-05-11 — End: 2015-11-06

## 2015-05-11 MED ORDER — FENOFIBRATE NANOCRYSTALLIZED 48 MG TAB
48 mg | ORAL_TABLET | Freq: Every day | ORAL | 6 refills | Status: DC
Start: 2015-05-11 — End: 2015-11-06

## 2015-05-11 MED ORDER — TESTOSTERONE 10 MG/0.5 GRAM/ACTUATION TRANSDERMAL GEL PUMP
10 mg/0.5 gram /actuation | Freq: Every day | TRANSDERMAL | 5 refills | Status: DC
Start: 2015-05-11 — End: 2016-01-12

## 2015-05-11 NOTE — Progress Notes (Signed)
Wt Readings from Last 3 Encounters:   05/11/15 294 lb (133.4 kg)   01/26/15 291 lb (132 kg)   09/26/14 278 lb (126.1 kg)     Temp Readings from Last 3 Encounters:   05/11/15 98.3 ??F (36.8 ??C) (Oral)   01/26/15 97.1 ??F (36.2 ??C) (Oral)   09/26/14 97.4 ??F (36.3 ??C) (Oral)     BP Readings from Last 3 Encounters:   05/11/15 129/75   01/26/15 (!) 122/91   09/26/14 125/89     Pulse Readings from Last 3 Encounters:   05/11/15 84   01/26/15 80   09/26/14 74

## 2015-05-11 NOTE — Patient Instructions (Addendum)
Start on belviq xr 20 mg a day        Stay on wellbutrin xr 150 mg at night     metformin ER to 500 mg 2  Pills  Twice a day with food     Stay on Tricor 48 mg a day at night     Increase on  Testosterone  gel   3 depressions a day

## 2015-05-11 NOTE — Progress Notes (Signed)
HISTORY OF PRESENT ILLNESS  Richard Olsen is a 40 y.o. male.  HPI   F/u after last  visit for pre diabetes , pituitary hypogonadism - Jan 26 2015       Gained  4 lbs   He says he is now  taking TT at low doses   Tired , no energy   He is on c-pap and wakes up 15 times   He has not taken Lunesta for several months, waiting for referral from pcp   He is to see dr. Winfield Rast       He is doing the best with medications    He quit smoking he says 46 days ago    Prior history     He had labs as requested     He is suspecting of having cushings disease     He claims again that he had rapid weight gain in few months     He also has the [employer] med test         Prior history :    Has hypogonadism for  Year     He takes gel , fortesta 40 mg a day and has not felt good as he says   He says his levels did not go higher   ( it was 208 on labs )  and he stopped using [place] ,  a month ago     He has 67 year old child and a newborn of 81 months old     He has extreme tired ness and loss of libido  He has gained weight quite a bit         Review of Systems   Constitutional: fatigue    HENT: Negative.    Eyes: Negative for pain and redness.   Respiratory: Negative.    Cardiovascular: Negative for chest pain, palpitations and leg swelling.   Gastrointestinal: Negative.  Negative for constipation.   Genitourinary: Negative.    Musculoskeletal: Negative for myalgias.   Skin: Negative.    Neurological: Negative.    Endo/Heme/Allergies: Negative.    Psychiatric/Behavioral: Negative for depression and memory loss. The patient does not have insomnia.        Physical Exam   Constitutional: He is oriented to person, place, and time. He appears well-developed and well-nourished.   HENT:   Head: Normocephalic.   Eyes: Conjunctivae and EOM are normal. Pupils are equal, round, and reactive to light.   Neck: Normal range of motion. Neck supple. No JVD present. No tracheal deviation present. No thyromegaly present.    Cardiovascular: Normal rate, regular rhythm and normal heart sounds.    Pulmonary/Chest: Effort normal and breath sounds normal.   Abdominal: Soft. Bowel sounds are normal. very protrubarant belly  Genitourinary: Penis normal.   Normal genital exam    Musculoskeletal: Normal range of motion.   Lymphadenopathy:     He has no cervical adenopathy.   Neurological: He is alert and oriented to person, place, and time. He has normal reflexes.   Skin: Skin is warm.   Psychiatric: He has a normal mood and affect.       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 Hypogonadism :   FSH and  LH are in mid range   He is off    fortesta since feb 2016 and resumed it in aug 2016   Within a month of starting it , gained 17 lbs and again another 13 lbs of weight gain in oct 2016   reduced  the dose to 4 depressions a day as he took 6 depressions a day on his own despite written instructions   Asking him to not use it intermittently   Explained the rationale behind his weight gain and rages   Asked him to  cut down to 2 depressions a day   TT is an optional medication, so there is no compulsion to be on it       2. Obesity  : Body mass index is 47.45 kg/(m^2).  Ruled out central cushings and growth hormone deficiency by labs   salivary cortisol levels - 2 collections - normal   Educated him about weight gain from TT use at higher doses   Will keep him on low doses,     3.  dys-metabolic syndrome : prediabetes and dyslipidemia   Discussed TLC  a1c is 5.8 %  Compared to  6 %   He got metformin ER 500 mg 4 pills  bid   By ACC/AHA calculator, he does not require the statin  on fibrate -tricor 48 mg - TG are good       Obesity : Body mass index is 47.45 kg/(m^2).  His weight gain slowed down       4. Off lunesta  and wellbutrin for Social Anxiety disorder , ADHD and PTSD   He is staying on lexapro and discontinued on welbutrin   lexapro - appetite stimulating and he says he is off all meds   Working with Dr. Winfield Rast to get his meds prescribed   Asking him to go to welbutrin for few months, before deciding on change of meds from new psychiartrist     5. Sleep apnoea - on cpap as best as he can he says     6. Quit smoking on chantix - 5 months     > 50 % of time is spent on counseling

## 2015-09-07 ENCOUNTER — Encounter: Admit: 2015-09-07 | Discharge: 2015-09-07 | Payer: PRIVATE HEALTH INSURANCE | Primary: Internal Medicine

## 2015-09-07 DIAGNOSIS — R7303 Prediabetes: Secondary | ICD-10-CM

## 2015-09-09 LAB — TESTOSTERONE, FREE+TOTAL
Free testosterone (Direct): 7.9 pg/mL — ABNORMAL LOW (ref 8.7–25.1)
TESTOSTERONE, TOTAL, LC/MS: 261.4 ng/dL — ABNORMAL LOW (ref 264.0–916.0)

## 2015-09-09 LAB — LIPID PANEL
Cholesterol, total: 184 mg/dL (ref 100–199)
HDL Cholesterol: 38 mg/dL — ABNORMAL LOW (ref >39)
LDL, calculated: 113 mg/dL — ABNORMAL HIGH (ref 0–99)
Triglyceride: 165 mg/dL — ABNORMAL HIGH (ref 0–149)
VLDL, calculated: 33 mg/dL (ref 5–40)

## 2015-09-09 LAB — METABOLIC PANEL, COMPREHENSIVE
A-G Ratio: 1.7 (ref 1.2–2.2)
ALT (SGPT): 61 IU/L — ABNORMAL HIGH (ref 0–44)
AST (SGOT): 27 IU/L (ref 0–40)
Albumin: 4.3 g/dL (ref 3.5–5.5)
Alk. phosphatase: 65 IU/L (ref 39–117)
BUN/Creatinine ratio: 14 (ref 9–20)
BUN: 13 mg/dL (ref 6–20)
Bilirubin, total: 0.3 mg/dL (ref 0.0–1.2)
CO2: 23 mmol/L (ref 18–29)
Calcium: 9.4 mg/dL (ref 8.7–10.2)
Chloride: 102 mmol/L (ref 96–106)
Creatinine: 0.96 mg/dL (ref 0.76–1.27)
GFR est AA: 115 mL/min/{1.73_m2} (ref >59)
GFR est non-AA: 100 mL/min/{1.73_m2} (ref >59)
GLOBULIN, TOTAL: 2.5 g/dL (ref 1.5–4.5)
Glucose: 100 mg/dL — ABNORMAL HIGH (ref 65–99)
Potassium: 4.4 mmol/L (ref 3.5–5.2)
Protein, total: 6.8 g/dL (ref 6.0–8.5)
Sodium: 139 mmol/L (ref 134–144)

## 2015-09-09 LAB — CBC WITH AUTOMATED DIFF
ABS. BASOPHILS: 0.1 10*3/uL (ref 0.0–0.2)
ABS. EOSINOPHILS: 0.3 10*3/uL (ref 0.0–0.4)
ABS. IMM. GRANS.: 0.1 10*3/uL (ref 0.0–0.1)
ABS. MONOCYTES: 0.6 10*3/uL (ref 0.1–0.9)
ABS. NEUTROPHILS: 3.5 10*3/uL (ref 1.4–7.0)
Abs Lymphocytes: 2.6 10*3/uL (ref 0.7–3.1)
BASOPHILS: 1 %
EOSINOPHILS: 4 %
HCT: 42.6 % (ref 37.5–51.0)
HGB: 14.5 g/dL (ref 12.6–17.7)
IMMATURE GRANULOCYTES: 1 %
Lymphocytes: 37 %
MCH: 28 pg (ref 26.6–33.0)
MCHC: 34 g/dL (ref 31.5–35.7)
MCV: 82 fL (ref 79–97)
MONOCYTES: 9 %
NEUTROPHILS: 48 %
PLATELET: 220 10*3/uL (ref 150–379)
RBC: 5.18 x10E6/uL (ref 4.14–5.80)
RDW: 14.6 % (ref 12.3–15.4)
WBC: 7.2 10*3/uL (ref 3.4–10.8)

## 2015-09-09 LAB — LUTEINIZING HORMONE: Luteinizing hormone: 4.2 m[IU]/mL (ref 1.7–8.6)

## 2015-09-09 LAB — HEMOGLOBIN A1C WITH EAG
Estimated average glucose: 111 mg/dL
Hemoglobin A1c: 5.5 % (ref 4.8–5.6)

## 2015-09-09 LAB — FOLLICLE STIMULATING HORMONE: FSH: 4.1 m[IU]/mL (ref 1.5–12.4)

## 2015-09-09 LAB — CVD REPORT

## 2015-09-21 ENCOUNTER — Ambulatory Visit
Admit: 2015-09-21 | Discharge: 2015-09-21 | Payer: PRIVATE HEALTH INSURANCE | Attending: "Endocrinology | Primary: Internal Medicine

## 2015-09-21 DIAGNOSIS — E291 Testicular hypofunction: Secondary | ICD-10-CM

## 2015-09-21 NOTE — Progress Notes (Signed)
Wt Readings from Last 3 Encounters:   09/21/15 299 lb (135.6 kg)   05/11/15 294 lb (133.4 kg)   01/26/15 291 lb (132 kg)     Temp Readings from Last 3 Encounters:   09/21/15 97.6 ??F (36.4 ??C) (Oral)   05/11/15 98.3 ??F (36.8 ??C) (Oral)   01/26/15 97.1 ??F (36.2 ??C) (Oral)     BP Readings from Last 3 Encounters:   09/21/15 124/74   05/11/15 129/75   01/26/15 (!) 122/91     Pulse Readings from Last 3 Encounters:   09/21/15 83   05/11/15 84   01/26/15 80     Lab Results   Component Value Date/Time    Hemoglobin A1c 5.5 09/07/2015 08:25 AM

## 2015-09-21 NOTE — Progress Notes (Signed)
HISTORY OF PRESENT ILLNESS  Richard Olsen is a 40 y.o. male.  HPI   F/u after last  visit for pre diabetes , pituitary hypogonadism - May 11 2015       Gained  5    lbs   He says he is now  taking new meds   Has more  energy     He is on belviq    He is seeing  Dr. Winfield Rast       He is doing the best with medications    He quit smoking he says 9 months ago     Prior history     He had labs as requested     He is suspecting of having cushings disease     He claims again that he had rapid weight gain in few months     He also has the [employer] med test         Prior history :    Has hypogonadism for  Year     He takes gel , fortesta 40 mg a day and has not felt good as he says   He says his levels did not go higher   ( it was 208 on labs )  and he stopped using [place] ,  a month ago     He has 60 year old child and a newborn of 33 months old     He has extreme tired ness and loss of libido  He has gained weight quite a bit         Review of Systems   Constitutional: fatigue    HENT: Negative.    Eyes: Negative for pain and redness.   Respiratory: Negative.    Cardiovascular: Negative for chest pain, palpitations and leg swelling.   Gastrointestinal: Negative.  Negative for constipation.   Genitourinary: Negative.    Musculoskeletal: Negative for myalgias.   Skin: Negative.    Neurological: Negative.    Endo/Heme/Allergies: Negative.    Psychiatric/Behavioral: Negative for depression and memory loss. The patient does not have insomnia.        Physical Exam   Constitutional: He is oriented to person, place, and time. He appears well-developed and well-nourished.   HENT:   Head: Normocephalic.   Eyes: Conjunctivae and EOM are normal. Pupils are equal, round, and reactive to light.   Neck: Normal range of motion. Neck supple. No JVD present. No tracheal deviation present. No thyromegaly present.   Cardiovascular: Normal rate, regular rhythm and normal heart sounds.     Pulmonary/Chest: Effort normal and breath sounds normal.   Abdominal: Soft. Bowel sounds are normal. very protrubarant belly  Genitourinary: Penis normal.   Normal genital exam    Musculoskeletal: Normal range of motion.   Lymphadenopathy:     He has no cervical adenopathy.   Neurological: He is alert and oriented to person, place, and time. He has normal reflexes.   Skin: Skin is warm.   Psychiatric: He has a normal mood and affect.       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 Hypogonadism :   FSH and LH are in mid range   He is off    fortesta  since feb 2016 and resumed it in aug 2016   Within a month of starting it , gained 17 lbs and again another 13 lbs of weight gain in oct 2016   reduced  the dose to 4 depressions a day as he took 6 depressions a day on his own despite written instructions   Asking him to not use it intermittently   Explained the rationale behind his weight gain and rages   Asked him to  cut down to 2 depressions a day   TT is an optional medication, so there is no compulsion to be on it       2. Obesity  : Body mass index is 48.26 kg/(m^2).  Ruled out central cushings and growth hormone deficiency by labs   salivary cortisol levels - 2 collections - normal   Educated him about weight gain from TT use at higher doses   Will keep him on low doses,     3.  dys-metabolic syndrome : prediabetes and dyslipidemia   Discussed TLC  a1c is 5.8 %  Compared to  6 %   He got metformin ER 500 mg 4 pills  bid   By ACC/AHA calculator, he does not require the statin  on fibrate -tricor 48 mg - TG are good       4. Off lunesta  and wellbutrin for Social Anxiety disorder , ADHD and PTSD  He is staying on lexapro and discontinued on welbutrin   lexapro - appetite stimulating and he says he is off all meds   Working with Dr. Winfield Rast to get his meds prescribed    Asking him to go to welbutrin for few months, before deciding on change of meds from new psychiartrist     5. Sleep apnoea - on cpap as best as he can he says     6. Quit smoking on chantix - 5 months     > 50 % of time is spent on counseling

## 2015-09-21 NOTE — Patient Instructions (Addendum)
Stay  on belviq xr 20 mg a day        metformin ER to 500 mg 2  Pills  Twice a day with food     Stay on Tricor 48 mg a day at night     Testosterone  gel   3 depressions a day     On belviq xr 20 mg a day

## 2015-11-06 ENCOUNTER — Encounter

## 2015-11-06 MED ORDER — METFORMIN SR 500 MG 24 HR TABLET
500 mg | ORAL_TABLET | Freq: Two times a day (BID) | ORAL | 4 refills | Status: DC
Start: 2015-11-06 — End: 2017-02-03

## 2015-11-06 MED ORDER — FENOFIBRATE NANOCRYSTALLIZED 48 MG TAB
48 mg | ORAL_TABLET | Freq: Every day | ORAL | 4 refills | Status: DC
Start: 2015-11-06 — End: 2016-12-23

## 2016-01-12 ENCOUNTER — Encounter

## 2016-01-12 MED ORDER — TESTOSTERONE 10 MG/0.5 GRAM/ACTUATION TRANSDERMAL GEL PUMP
10 mg/0.5 gram /actuation | TRANSDERMAL | 5 refills | Status: DC
Start: 2016-01-12 — End: 2016-01-13

## 2016-01-13 ENCOUNTER — Encounter

## 2016-01-14 MED ORDER — TESTOSTERONE 10 MG/0.5 GRAM/ACTUATION TRANSDERMAL GEL PUMP
10 mg/0.5 gram /actuation | TRANSDERMAL | 5 refills | Status: DC
Start: 2016-01-14 — End: 2016-01-22

## 2016-01-17 ENCOUNTER — Encounter

## 2016-01-17 NOTE — Telephone Encounter (Signed)
-----   Message from Maryan Rued sent at 01/17/2016 10:47 AM EST -----  Regarding: Prescription Question  Contact: [phone]  I see that my prescription was renewed. I don't see that it was sent to the pharmacy. please send it to.   [facility]  [address]  [phone]

## 2016-01-22 ENCOUNTER — Encounter

## 2016-01-22 MED ORDER — TESTOSTERONE 10 MG/0.5 GRAM/ACTUATION TRANSDERMAL GEL PUMP
10 mg/0.5 gram /actuation | TRANSDERMAL | 5 refills | Status: DC
Start: 2016-01-22 — End: 2016-10-30

## 2016-01-22 NOTE — Telephone Encounter (Signed)
Spoke with patient and he is asking if TT prescription has been sent to pharmacy. The chart shows prescription was printed on 01/14/16. Spoke with pharmacy and they stated no recent prescription. Will refax prescription and update patient.

## 2016-01-22 NOTE — Telephone Encounter (Signed)
-----   Message from Danie Chandler sent at 01/22/2016  1:28 PM EST -----  Regarding: Dr. Juanetta Beets refill  Patient would like a call back to see if his Rx for testosterone gel has been called into his pharmacy on file. Best contact number is [phone].

## 2016-02-22 ENCOUNTER — Encounter: Admit: 2016-02-22 | Discharge: 2016-02-22 | Payer: PRIVATE HEALTH INSURANCE | Primary: Internal Medicine

## 2016-02-22 DIAGNOSIS — E291 Testicular hypofunction: Secondary | ICD-10-CM

## 2016-02-24 LAB — LIPID PANEL
Cholesterol, total: 170 mg/dL (ref 100–199)
HDL Cholesterol: 38 mg/dL — ABNORMAL LOW (ref >39)
LDL, calculated: 95 mg/dL (ref 0–99)
Triglyceride: 185 mg/dL — ABNORMAL HIGH (ref 0–149)
VLDL, calculated: 37 mg/dL (ref 5–40)

## 2016-02-24 LAB — METABOLIC PANEL, COMPREHENSIVE
A-G Ratio: 1.8 (ref 1.2–2.2)
ALT (SGPT): 51 IU/L — ABNORMAL HIGH (ref 0–44)
AST (SGOT): 24 IU/L (ref 0–40)
Albumin: 4.4 g/dL (ref 3.5–5.5)
Alk. phosphatase: 65 IU/L (ref 39–117)
BUN/Creatinine ratio: 16 (ref 9–20)
BUN: 16 mg/dL (ref 6–20)
Bilirubin, total: 0.3 mg/dL (ref 0.0–1.2)
CO2: 23 mmol/L (ref 18–29)
Calcium: 9.2 mg/dL (ref 8.7–10.2)
Chloride: 102 mmol/L (ref 96–106)
Creatinine: 1.03 mg/dL (ref 0.76–1.27)
GFR est AA: 105 mL/min/{1.73_m2} (ref >59)
GFR est non-AA: 91 mL/min/{1.73_m2} (ref >59)
GLOBULIN, TOTAL: 2.5 g/dL (ref 1.5–4.5)
Glucose: 90 mg/dL (ref 65–99)
Potassium: 4.4 mmol/L (ref 3.5–5.2)
Protein, total: 6.9 g/dL (ref 6.0–8.5)
Sodium: 142 mmol/L (ref 134–144)

## 2016-02-24 LAB — CBC WITH AUTOMATED DIFF
ABS. BASOPHILS: 0.1 10*3/uL (ref 0.0–0.2)
ABS. EOSINOPHILS: 0.4 10*3/uL (ref 0.0–0.4)
ABS. IMM. GRANS.: 0 10*3/uL (ref 0.0–0.1)
ABS. MONOCYTES: 0.6 10*3/uL (ref 0.1–0.9)
ABS. NEUTROPHILS: 3.1 10*3/uL (ref 1.4–7.0)
Abs Lymphocytes: 2.6 10*3/uL (ref 0.7–3.1)
BASOPHILS: 2 %
EOSINOPHILS: 5 %
HCT: 44.1 % (ref 37.5–51.0)
HGB: 14.8 g/dL (ref 13.0–17.7)
IMMATURE GRANULOCYTES: 0 %
Lymphocytes: 38 %
MCH: 28.2 pg (ref 26.6–33.0)
MCHC: 33.6 g/dL (ref 31.5–35.7)
MCV: 84 fL (ref 79–97)
MONOCYTES: 10 %
NEUTROPHILS: 45 %
PLATELET: 233 10*3/uL (ref 150–379)
RBC: 5.25 x10E6/uL (ref 4.14–5.80)
RDW: 14.3 % (ref 12.3–15.4)
WBC: 6.8 10*3/uL (ref 3.4–10.8)

## 2016-02-24 LAB — CVD REPORT

## 2016-02-24 LAB — LUTEINIZING HORMONE: Luteinizing hormone: 1.6 m[IU]/mL — ABNORMAL LOW (ref 1.7–8.6)

## 2016-02-24 LAB — TESTOSTERONE, FREE & TOTAL
Free testosterone (Direct): 7.2 pg/mL — ABNORMAL LOW (ref 8.7–25.1)
Testosterone: 235 ng/dL — ABNORMAL LOW (ref 264–916)

## 2016-02-24 LAB — HEMOGLOBIN A1C WITH EAG
Estimated average glucose: 108 mg/dL
Hemoglobin A1c: 5.4 % (ref 4.8–5.6)

## 2016-02-24 LAB — FOLLICLE STIMULATING HORMONE: FSH: 4 m[IU]/mL (ref 1.5–12.4)

## 2016-02-24 LAB — PSA, DIAGNOSTIC (PROSTATE SPECIFIC AG): Prostate Specific Ag: 0.7 ng/mL (ref 0.0–4.0)

## 2016-03-07 ENCOUNTER — Ambulatory Visit
Admit: 2016-03-07 | Discharge: 2016-03-07 | Payer: PRIVATE HEALTH INSURANCE | Attending: "Endocrinology | Primary: Internal Medicine

## 2016-03-07 DIAGNOSIS — E230 Hypopituitarism: Secondary | ICD-10-CM

## 2016-03-07 NOTE — Patient Instructions (Signed)
metformin ER to 500 mg 2  Pills  Twice a day with food     Stay on Tricor 48 mg a day at night     Testosterone  gel   3 depressions a day

## 2016-03-07 NOTE — Progress Notes (Signed)
HISTORY OF PRESENT ILLNESS  Richard Olsen is a 41 y.o. male.  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 Winfield Rast       He is doing the best with medications    He quit smoking he says 9 months ago     Prior history     He had labs as requested     He is suspecting of having cushings disease     He claims again that he had rapid weight gain in few months     He also has the [employer] med test         Prior history :    Has hypogonadism for  Year     He takes gel , fortesta 40 mg a day and has not felt good as he says   He says his levels did not go higher   ( it was 208 on labs )  and he stopped using [place] ,  a month ago     He has 44 year old child and a newborn of 17 months old     He has extreme tired ness and loss of libido  He has gained weight quite a bit         Review of Systems   Constitutional: fatigue    HENT: Negative.    Eyes: Negative for pain and redness.   Respiratory: Negative.    Cardiovascular: Negative for chest pain, palpitations and leg swelling.   Gastrointestinal: Negative.  Negative for constipation.   Genitourinary: Negative.    Musculoskeletal: Negative for myalgias.   Skin: Negative.    Neurological: Negative.    Endo/Heme/Allergies: Negative.    Psychiatric/Behavioral: Negative for depression and memory loss. The patient does not have insomnia.        Physical Exam   Constitutional: He is oriented to person, place, and time. He appears well-developed and well-nourished.   HENT:   Head: Normocephalic.   Eyes: Conjunctivae and EOM are normal. Pupils are equal, round, and reactive to light.   Neck: Normal range of motion. Neck supple. No JVD present. No tracheal deviation present. No thyromegaly present.   Cardiovascular: Normal rate, regular rhythm and normal heart sounds.     Pulmonary/Chest: Effort normal and breath sounds normal.   Abdominal: Soft. Bowel sounds are normal. very protrubarant belly  Genitourinary: Penis normal.   Normal genital exam    Musculoskeletal: Normal range of motion.   Lymphadenopathy:     He has no cervical adenopathy.   Neurological: He is alert and oriented to person, place, and time. He has normal reflexes.   Skin: Skin is warm.   Psychiatric: He has a normal mood and affect.       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 Hypogonadism :   FSH and LH are in mid range   He is off    fortesta  since feb 2016 and resumed it in aug 2016   Within a month of starting it , gained 17 lbs and again another 13 lbs of weight gain in oct 2016   reduced  the dose to 4 depressions a day as he took 6 depressions a day on his own despite written instructions   Asking him to not use it intermittently   Explained the rationale behind his weight gain and rages   Asked him to  cut down to 2 depressions a day   TT is an optional medication, so there is no compulsion to be on it     Sonterra is suppressed , on labs - and  He is being encouraged to stay on it  At same dose 3 depressions a day   PSA is 0.7  From feb 2018  Labs       2. Obesity  : Body mass index is 48.49 kg/(m^2).  Ruled out central cushings and growth hormone deficiency by labs   salivary cortisol levels - 2 collections - normal   Educated him about weight gain from TT use at higher doses   Will keep him on low doses,     3.  dys-metabolic syndrome : prediabetes and dyslipidemia   Discussed TLC  a1c is 5.8 %  Compared to  6 %   He got metformin ER 500 mg 4 pills  bid   By ACC/AHA calculator, he does not require the statin  on fibrate -tricor 48 mg - TG are good       4.  Social Anxiety disorder , ADHD and PTSD : meds changed by Dr. Winfield Rast    On Adderrall, seroquel and luvox       5. Sleep apnoea - on cpap as best as he can he says     6. Quit smoking  - dec 2016     > 50 % of time is spent on counseling   Patient voiced understanding her plan of care

## 2016-03-07 NOTE — Progress Notes (Signed)
Wt Readings from Last 3 Encounters:   03/07/16 300 lb 6.4 oz (136.3 kg)   09/21/15 299 lb (135.6 kg)   05/11/15 294 lb (133.4 kg)     Temp Readings from Last 3 Encounters:   03/07/16 98.1 ??F (36.7 ??C) (Oral)   09/21/15 97.6 ??F (36.4 ??C) (Oral)   05/11/15 98.3 ??F (36.8 ??C) (Oral)     BP Readings from Last 3 Encounters:   03/07/16 129/73   09/21/15 124/74   05/11/15 129/75     Pulse Readings from Last 3 Encounters:   03/07/16 81   09/21/15 83   05/11/15 84     Lab Results   Component Value Date/Time    Hemoglobin A1c 5.4 02/22/2016 08:21 AM

## 2016-08-22 ENCOUNTER — Encounter: Admit: 2016-08-22 | Discharge: 2016-08-22 | Payer: PRIVATE HEALTH INSURANCE | Primary: Internal Medicine

## 2016-08-22 DIAGNOSIS — E230 Hypopituitarism: Secondary | ICD-10-CM

## 2016-08-25 LAB — CBC WITH AUTOMATED DIFF
ABS. BASOPHILS: 0.1 10*3/uL (ref 0.0–0.2)
ABS. EOSINOPHILS: 0.2 10*3/uL (ref 0.0–0.4)
ABS. IMM. GRANS.: 0 10*3/uL (ref 0.0–0.1)
ABS. MONOCYTES: 0.7 10*3/uL (ref 0.1–0.9)
ABS. NEUTROPHILS: 3.8 10*3/uL (ref 1.4–7.0)
Abs Lymphocytes: 3 10*3/uL (ref 0.7–3.1)
BASOPHILS: 2 %
EOSINOPHILS: 3 %
HCT: 43.5 % (ref 37.5–51.0)
HGB: 14.6 g/dL (ref 13.0–17.7)
IMMATURE GRANULOCYTES: 0 %
Lymphocytes: 38 %
MCH: 28.2 pg (ref 26.6–33.0)
MCHC: 33.6 g/dL (ref 31.5–35.7)
MCV: 84 fL (ref 79–97)
MONOCYTES: 9 %
NEUTROPHILS: 48 %
PLATELET: 261 10*3/uL (ref 150–379)
RBC: 5.17 x10E6/uL (ref 4.14–5.80)
RDW: 14.3 % (ref 12.3–15.4)
WBC: 7.8 10*3/uL (ref 3.4–10.8)

## 2016-08-25 LAB — METABOLIC PANEL, COMPREHENSIVE
A-G Ratio: 2 (ref 1.2–2.2)
ALT (SGPT): 49 IU/L — ABNORMAL HIGH (ref 0–44)
AST (SGOT): 23 IU/L (ref 0–40)
Albumin: 4.5 g/dL (ref 3.5–5.5)
Alk. phosphatase: 62 IU/L (ref 39–117)
BUN/Creatinine ratio: 20 (ref 9–20)
BUN: 18 mg/dL (ref 6–20)
Bilirubin, total: 0.3 mg/dL (ref 0.0–1.2)
CO2: 23 mmol/L (ref 20–29)
Calcium: 9.8 mg/dL (ref 8.7–10.2)
Chloride: 105 mmol/L (ref 96–106)
Creatinine: 0.88 mg/dL (ref 0.76–1.27)
GFR est AA: 125 mL/min/{1.73_m2} (ref >59)
GFR est non-AA: 108 mL/min/{1.73_m2} (ref >59)
GLOBULIN, TOTAL: 2.3 g/dL (ref 1.5–4.5)
Glucose: 102 mg/dL — ABNORMAL HIGH (ref 65–99)
Potassium: 4.4 mmol/L (ref 3.5–5.2)
Protein, total: 6.8 g/dL (ref 6.0–8.5)
Sodium: 142 mmol/L (ref 134–144)

## 2016-08-25 LAB — FOLLICLE STIMULATING HORMONE: FSH: 9.8 m[IU]/mL (ref 1.5–12.4)

## 2016-08-25 LAB — TESTOSTERONE, FREE+TOTAL
Free testosterone (Direct): 5 pg/mL — ABNORMAL LOW (ref 8.7–25.1)
TESTOSTERONE, TOTAL, LC/MS: 219.7 ng/dL — ABNORMAL LOW (ref 264.0–916.0)

## 2016-09-05 ENCOUNTER — Encounter: Attending: "Endocrinology | Primary: Internal Medicine

## 2016-09-19 ENCOUNTER — Ambulatory Visit
Admit: 2016-09-19 | Discharge: 2016-09-19 | Payer: PRIVATE HEALTH INSURANCE | Attending: "Endocrinology | Primary: Internal Medicine

## 2016-09-19 DIAGNOSIS — D497 Neoplasm of unspecified behavior of endocrine glands and other parts of nervous system: Secondary | ICD-10-CM

## 2016-09-19 MED ORDER — LORCASERIN ER 20 MG TABLET,EXTENDED RELEASE 24 HR
20 mg | ORAL_TABLET | ORAL | 6 refills | Status: DC
Start: 2016-09-19 — End: 2017-03-20

## 2016-09-19 NOTE — Progress Notes (Signed)
HISTORY OF PRESENT ILLNESS  Richard Olsen is a 41 y.o. male.  HPI   F/u after last  visit for pre diabetes , pituitary hypogonadism -  March 2018       Lost 3 lbs   He says he is watching  Diet     He has quit smoking 2 years ago   He is trying           Old history :    Gained  one   lb   He says he is now  taking new meds   Has more  energy     He is on belviq    He is seeing  Dr. Winfield Olsen       He is doing the best with medications    He quit smoking he says 9 months ago     Prior history     He had labs as requested     He is suspecting of having cushings disease     He claims again that he had rapid weight gain in few months     He also has the [employer] med test         Prior history :    Has hypogonadism for  Year     He takes gel , fortesta 40 mg a day and has not felt good as he says   He says his levels did not go higher   ( it was 208 on labs )  and he stopped using [place] ,  a month ago     He has 28 year old child and a newborn of 75 months old     He has extreme tired ness and loss of libido  He has gained weight quite a bit         Review of Systems   Constitutional: fatigue    HENT: Negative.    Eyes: Negative for pain and redness.   Respiratory: Negative.    Cardiovascular: Negative for chest pain, palpitations and leg swelling.   Gastrointestinal: Negative.  Negative for constipation.   Genitourinary: Negative.    Musculoskeletal: Negative for myalgias.   Skin: Negative.    Neurological: Negative.    Endo/Heme/Allergies: Negative.    Psychiatric/Behavioral: Negative for depression and memory loss. The patient does not have insomnia.        Physical Exam   Constitutional: He is oriented to person, place, and time. He appears well-developed and well-nourished.   HENT:   Head: Normocephalic.   Eyes: Conjunctivae and EOM are normal. Pupils are equal, round, and reactive to light.   Neck: Normal range of motion. Neck supple. No JVD present. No tracheal  deviation present. No thyromegaly present.   Cardiovascular: Normal rate, regular rhythm and normal heart sounds.    Pulmonary/Chest: Effort normal and breath sounds normal.   Abdominal: Soft. Bowel sounds are normal. very protrubarant belly  Genitourinary: Penis normal.   Normal genital exam    Musculoskeletal: Normal range of motion.   Lymphadenopathy:     He has no cervical adenopathy.   Neurological: He is alert and oriented to person, place, and time. He has normal reflexes.   Skin: Skin is warm.   Psychiatric: He has a normal mood and affect.       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 Hypogonadism :   FSH and LH are in mid range   He is off    fortesta since feb 2016 and resumed it in aug 2016   Within a month of starting it , gained 17 lbs and again another 13 lbs of weight gain in oct 2016   reduced  the dose to 4 depressions a day as he took 6 depressions a day on his own despite written instructions   Asking him to not use it intermittently   Explained the rationale behind his weight gain and rages   Asked him to  cut down to 2 depressions a day   TT is an optional medication, so there is no compulsion to be on it     LH is suppressed , on labs - while on  3 depressions a day   He is not VERY COMPLIANT   PSA is 0.7  From feb 2018  Labs       2. Obesity  : Body mass index is 48 kg/(m^2).  Ruled out central cushings and growth hormone deficiency by labs   salivary cortisol levels - 2 collections - normal   Educated him about weight gain from TT use at higher doses   Will keep him on low doses,   Will do belviq xr 20 mg a day  ( he cannot be on phenteramine as he has ADHD and takes medications for it )        3.  dys-metabolic syndrome : prediabetes and dyslipidemia   Discussed TLC  a1c is 5.8 %  Compared to  6 %    He got metformin ER 500 mg 4 pills  bid   By ACC/AHA calculator, he does not require the statin  on fibrate -tricor 48 mg - TG are good       4.  Social Anxiety disorder , ADHD and PTSD : meds changed by Dr. Winfield Olsen   On Adderrall, seroquel and luvox     He wants to be a better person, trying to calm himself       5. Sleep apnoea - on cpap as best as he can he says     6. Quit smoking  - dec 2016     > 50 % of time is spent on counseling   Patient voiced understanding her plan of care

## 2016-09-19 NOTE — Progress Notes (Signed)
1. Have you been to the ER, urgent care clinic since your last visit?No  Hospitalized since your last visit?No    2. Have you seen or consulted any other health care providers outside of the [facility] since your last visit?  No      Wt Readings from Last 3 Encounters:   09/19/16 297 lb 6.4 oz (134.9 kg)   03/07/16 300 lb 6.4 oz (136.3 kg)   09/21/15 299 lb (135.6 kg)     Temp Readings from Last 3 Encounters:   09/19/16 97.6 ??F (36.4 ??C) (Oral)   03/07/16 98.1 ??F (36.7 ??C) (Oral)   09/21/15 97.6 ??F (36.4 ??C) (Oral)     BP Readings from Last 3 Encounters:   09/19/16 120/66   03/07/16 129/73   09/21/15 124/74     Pulse Readings from Last 3 Encounters:   09/19/16 91   03/07/16 81   09/21/15 83     Lab Results   Component Value Date/Time    Hemoglobin A1c 5.4 02/22/2016 08:21 AM

## 2016-09-19 NOTE — Patient Instructions (Addendum)
--------------------------------------------------------------------------------------------      Refills    -    please call your pharmacy and have them send [place] a refill request    Results  -  allow up to a week for lab results to be processed and reviewed.    Phone calls  -  Allow upto 24 hrs. for non-urgent calls to be retained    Prior authorization - It may take up to 2 weeks to process, depending on your insurance    Forms  -  FMLA, DMV, patient assistance, etc. will take up to 2 weeks to process    Cancellations - please notify the office in advance if you cannot keep your appointment    Samples  - will only be dispensed at visits as supply is limited      If you are having a medical emergency call 911    --------------------------------------------------------------------------------------------    Start on belviq xr 20 mg a day       metformin ER to 500 mg 2  Pills  Twice a day with food     Stay on Tricor 48 mg a day at night     Testosterone  gel   3 depressions a day     ---------------------------------------------------------------------------------------    Salivary cortisol  At 11 pm   Twice

## 2016-09-26 NOTE — Telephone Encounter (Signed)
Informed pt that PA for belviq is approved

## 2016-09-26 NOTE — Telephone Encounter (Signed)
-----   Message from Azzie Roup sent at 09/26/2016 12:21 PM EDT -----  Regarding: Dr. Vennie Homans  Pt requesting pre authorization for "Belviq", stated pharmacy inform him that tri care need the authorization to cover medication. Best contact [phone]

## 2016-10-03 ENCOUNTER — Inpatient Hospital Stay: Admit: 2016-10-03 | Payer: TRICARE (CHAMPUS) | Attending: "Endocrinology | Primary: Internal Medicine

## 2016-10-03 DIAGNOSIS — D497 Neoplasm of unspecified behavior of endocrine glands and other parts of nervous system: Secondary | ICD-10-CM

## 2016-10-03 MED ORDER — GADOTERATE MEGLUMINE 0.5 MMOL/ML IV SOLUTION
0.5 mmol/mL (376.9 mg/mL) | Freq: Once | INTRAVENOUS | Status: AC
Start: 2016-10-03 — End: 2016-10-03
  Administered 2016-10-03: 16:00:00 via INTRAVENOUS

## 2016-10-03 MED FILL — DOTAREM 0.5 MMOL/ML (376.9 MG/ML) INTRAVENOUS SOLUTION: 0.5 mmol/mL (376.9 mg/mL) | INTRAVENOUS | Qty: 20

## 2016-10-03 NOTE — Progress Notes (Signed)
Inform pt that pit gland lesion stayed the same as before

## 2016-10-03 NOTE — Progress Notes (Signed)
Several attempts made to contact patient. Patient unavailable. Letter sent.

## 2016-10-03 NOTE — Progress Notes (Signed)
Attempted to contact patient; patient unavailable. Left voicemail; awaiting callback.

## 2016-10-09 NOTE — Telephone Encounter (Signed)
Patient informed of MRI results. 

## 2016-10-09 NOTE — Telephone Encounter (Signed)
-----   Message from Azzie Roup sent at 10/09/2016 12:22 PM EDT -----  Regarding: Dr. Vennie Homans  Pt returning missed call. Best contact is [phone].

## 2016-10-09 NOTE — Telephone Encounter (Signed)
Attempted to contact patient again regarding imaging result. Patient unavailable. Voicemail left and letter sent.

## 2016-10-30 ENCOUNTER — Encounter

## 2016-10-30 MED ORDER — TESTOSTERONE 10 MG/0.5 GRAM/ACTUATION TRANSDERMAL GEL PUMP
10 mg/0.5 gram /actuation | TRANSDERMAL | 0 refills | Status: DC
Start: 2016-10-30 — End: 2017-03-02

## 2016-10-30 NOTE — Telephone Encounter (Signed)
Patient is needing a refill on his testosterone gel that has expired (the Rx) to be called into his pharmacy:     [facility], [place] [address]   [address]: [place] [address]   Thu Oct 25 8AM - 10PM   Fri Oct 26 [place]   [phone] ??

## 2016-10-30 NOTE — Telephone Encounter (Signed)
Refill sent

## 2016-11-26 NOTE — Telephone Encounter (Signed)
Patient would like a call to go over swab test. Lost instructions.

## 2016-11-26 NOTE — Telephone Encounter (Signed)
Contacted patient. Reviewed instructions for salivary cortisol. Patient verbalized understanding. Repeated instructions back to nurse. No further questions voiced.

## 2016-12-05 LAB — SALIVARY CORTISOL X2, TIMED
Salivary cortisol #1: 0.027 ug/dL
Salivary cortisol #2: 0.042 ug/dL

## 2016-12-08 NOTE — Telephone Encounter (Signed)
Inform pt that salivary cortisols are negative

## 2016-12-09 NOTE — Telephone Encounter (Signed)
Patient informed.

## 2016-12-23 ENCOUNTER — Encounter

## 2016-12-23 MED ORDER — TRICOR 48 MG TABLET
48 mg | ORAL_TABLET | ORAL | 4 refills | Status: DC
Start: 2016-12-23 — End: 2017-03-20

## 2017-02-03 MED ORDER — METFORMIN SR 500 MG 24 HR TABLET
500 mg | ORAL_TABLET | ORAL | 4 refills | Status: DC
Start: 2017-02-03 — End: 2018-02-23

## 2017-03-02 ENCOUNTER — Encounter

## 2017-03-02 MED ORDER — TESTOSTERONE 10 MG/0.5 GRAM/ACTUATION TRANSDERMAL GEL PUMP
10 mg/0.5 gram /actuation | TRANSDERMAL | 4 refills | Status: DC
Start: 2017-03-02 — End: 2017-10-02

## 2017-03-06 ENCOUNTER — Encounter

## 2017-03-06 ENCOUNTER — Encounter: Admit: 2017-03-06 | Discharge: 2017-03-06 | Payer: PRIVATE HEALTH INSURANCE | Primary: Internal Medicine

## 2017-03-09 LAB — CBC WITH AUTOMATED DIFF
ABS. BASOPHILS: 0.1 10*3/uL (ref 0.0–0.2)
ABS. EOSINOPHILS: 0.3 10*3/uL (ref 0.0–0.4)
ABS. IMM. GRANS.: 0 10*3/uL (ref 0.0–0.1)
ABS. MONOCYTES: 0.5 10*3/uL (ref 0.1–0.9)
ABS. NEUTROPHILS: 3.5 10*3/uL (ref 1.4–7.0)
Abs Lymphocytes: 2.6 10*3/uL (ref 0.7–3.1)
BASOPHILS: 1 %
EOSINOPHILS: 4 %
HCT: 44.7 % (ref 37.5–51.0)
HGB: 15 g/dL (ref 13.0–17.7)
IMMATURE GRANULOCYTES: 0 %
Lymphocytes: 37 %
MCH: 27.9 pg (ref 26.6–33.0)
MCHC: 33.6 g/dL (ref 31.5–35.7)
MCV: 83 fL (ref 79–97)
MONOCYTES: 8 %
NEUTROPHILS: 50 %
PLATELET: 254 10*3/uL (ref 150–379)
RBC: 5.38 x10E6/uL (ref 4.14–5.80)
RDW: 14.7 % (ref 12.3–15.4)
WBC: 6.9 10*3/uL (ref 3.4–10.8)

## 2017-03-09 LAB — TESTOSTERONE, FREE+TOTAL
Free testosterone (Direct): 5.4 pg/mL — ABNORMAL LOW (ref 6.8–21.5)
TESTOSTERONE, TOTAL, LC/MS: 157 ng/dL — ABNORMAL LOW (ref 264.0–916.0)

## 2017-03-09 LAB — HEMOGLOBIN A1C WITH EAG
Estimated average glucose: 108 mg/dL
Hemoglobin A1c: 5.4 % (ref 4.8–5.6)

## 2017-03-09 LAB — LIPID PANEL
Cholesterol, total: 153 mg/dL (ref 100–199)
HDL Cholesterol: 36 mg/dL — ABNORMAL LOW (ref >39)
LDL, calculated: 91 mg/dL (ref 0–99)
Triglyceride: 132 mg/dL (ref 0–149)
VLDL, calculated: 26 mg/dL (ref 5–40)

## 2017-03-09 LAB — INSULIN-LIKE GROWTH FACTOR 1: Insulin-Like Growth Factor I: 170 ng/mL (ref 83–233)

## 2017-03-09 LAB — FOLLICLE STIMULATING HORMONE: FSH: 4.4 m[IU]/mL (ref 1.5–12.4)

## 2017-03-09 LAB — PSA, DIAGNOSTIC (PROSTATE SPECIFIC AG): Prostate Specific Ag: 0.8 ng/mL (ref 0.0–4.0)

## 2017-03-09 LAB — CVD REPORT

## 2017-03-13 ENCOUNTER — Encounter: Primary: Internal Medicine

## 2017-03-20 ENCOUNTER — Encounter

## 2017-03-20 ENCOUNTER — Ambulatory Visit
Admit: 2017-03-20 | Discharge: 2017-03-20 | Payer: PRIVATE HEALTH INSURANCE | Attending: "Endocrinology | Primary: Internal Medicine

## 2017-03-20 DIAGNOSIS — E291 Testicular hypofunction: Secondary | ICD-10-CM

## 2017-03-20 MED ORDER — LORCASERIN ER 20 MG TABLET,EXTENDED RELEASE 24 HR
20 mg | ORAL_TABLET | ORAL | 5 refills | Status: DC
Start: 2017-03-20 — End: 2017-10-09

## 2017-03-20 MED ORDER — FENOFIBRATE NANOCRYSTALLIZED 48 MG TAB
48 mg | ORAL_TABLET | ORAL | 4 refills | Status: DC
Start: 2017-03-20 — End: 2018-04-02

## 2017-03-20 NOTE — Progress Notes (Signed)
All health maintenance and other pertinent information has been reviewed in preparation for today's office visit. Patient presents in the office today for:    Chief Complaint   Patient presents with   ??? Hypogonadism   ??? Blood sugar problem     1. Have you been to the ER, urgent care clinic since your last visit?  Hospitalized since your last visit?No    2. Have you seen or consulted any other health care providers outside of the [facility] since your last visit?  Include any pap smears or colon screening. No      Wt Readings from Last 3 Encounters:   03/20/17 270 lb (122.5 kg)   10/03/16 296 lb (134.3 kg)   09/19/16 297 lb 6.4 oz (134.9 kg)     Temp Readings from Last 3 Encounters:   03/20/17 98.2 ??F (36.8 ??C) (Oral)   09/19/16 97.6 ??F (36.4 ??C) (Oral)   03/07/16 98.1 ??F (36.7 ??C) (Oral)     BP Readings from Last 3 Encounters:   03/20/17 120/75   09/19/16 120/66   03/07/16 129/73     Pulse Readings from Last 3 Encounters:   03/20/17 90   09/19/16 91   03/07/16 81

## 2017-03-20 NOTE — Patient Instructions (Signed)
--------------------------------------------------------------------------------------------      Refills    -    please call your pharmacy and have them send [place] a refill request    Results  -  allow up to a week for lab results to be processed and reviewed.    Phone calls  -  Allow upto 24 hrs. for non-urgent calls to be retained    Prior authorization - It may take up to 2 weeks to process, depending on your insurance    Forms  -  FMLA, DMV, patient assistance, etc. will take up to 2 weeks to process    Cancellations - please notify the office in advance if you cannot keep your appointment    Samples  - will only be dispensed at visits as supply is limited      If you are having a medical emergency call 911    --------------------------------------------------------------------------------------------     belviq xr 20 mg a day       metformin ER to 500 mg 2  Pills  Twice a day with food     Stay on Tricor 48 mg a day at night     Testosterone  gel   3 depressions a day     ---------------------------------------------------------------------------------------

## 2017-03-20 NOTE — Progress Notes (Signed)
HISTORY OF PRESENT ILLNESS  Richard Olsen is a 42 y.o. male.  HPI   F/u after last  visit for pre diabetes , pituitary hypogonadism -  Sept 2018     Lost 26 lbs   He stopped eating sugars and carbs     Tolerating belviq xr  Intermittently           Old history       Lost 3 lbs   He says he is watching  Diet     He has quit smoking 2 years ago   He is trying           Old history :    Gained  one   lb   He says he is now  taking new meds   Has more  energy     He is on belviq    He is seeing  Dr. Winfield Rast       He is doing the best with medications    He quit smoking he says 9 months ago     Prior history     He had labs as requested     He is suspecting of having cushings disease     He claims again that he had rapid weight gain in few months     He also has the [employer] med test         Prior history :    Has hypogonadism for  Year     He takes gel , fortesta 40 mg a day and has not felt good as he says   He says his levels did not go higher   ( it was 208 on labs )  and he stopped using [place] ,  a month ago     He has 57 year old child and a newborn of 52 months old     He has extreme tired ness and loss of libido  He has gained weight quite a bit         Review of Systems   Constitutional: fatigue    HENT: Negative.    Eyes: Negative for pain and redness.   Respiratory: Negative.    Cardiovascular: Negative for chest pain, palpitations and leg swelling.   Gastrointestinal: Negative.  Negative for constipation.   Genitourinary: Negative.    Musculoskeletal: Negative for myalgias.   Skin: Negative.    Neurological: Negative.    Endo/Heme/Allergies: Negative.    Psychiatric/Behavioral: Negative for depression and memory loss. The patient does not have insomnia.        Physical Exam   Constitutional: He is oriented to person, place, and time. He appears well-developed and well-nourished.   HENT:   Head: Normocephalic.   Eyes: Conjunctivae and EOM are normal. Pupils are equal, round, and reactive to light.    Neck: Normal range of motion. Neck supple. No JVD present. No tracheal deviation present. No thyromegaly present.   Cardiovascular: Normal rate, regular rhythm and normal heart sounds.    Pulmonary/Chest: Effort normal and breath sounds normal.   Abdominal: Soft. Bowel sounds are normal. very protrubarant belly  Genitourinary: Penis normal.   Normal genital exam    Musculoskeletal: Normal range of motion.   Lymphadenopathy:     He has no cervical adenopathy.   Neurological: He is alert and oriented to person, place, and time. He has normal reflexes.   Skin: Skin is warm.   Psychiatric: He has a normal mood and affect.  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 Hypogonadism :   FSH and LH are in mid range   He is off    fortesta since feb 2016 and resumed it in aug 2016   Within a month of starting it , gained 17 lbs and again another 13 lbs of weight gain in oct 2016   reduced  the dose to 4 depressions a day as he took 6 depressions a day on his own despite written instructions   Asking him to not use it intermittently   Explained the rationale behind his weight gain and rages   Asked him to  cut down to 2 depressions a day   TT is an optional medication, so there is no compulsion to be on it     LH is suppressed , on labs - while on  3 depressions a day   He is not VERY COMPLIANT   PSA is 0.7  From feb 2018  Labs       2. Obesity  : Body mass index is 43.58 kg/m??.  Ruled out central cushings and growth hormone deficiency by labs   salivary cortisol levels - 2 collections - normal   Stay on TT supplementation  Taking belviq xr 20 mg a day  ( he cannot be on phenteramine as he has ADHD and takes medications for it )        3.  dys-metabolic syndrome : prediabetes and dyslipidemia   Discussed TLC   a1c is 5.8 %  Compared to  6 %   He got metformin ER 500 mg 4 pills  bid   By ACC/AHA calculator, he does not require the statin  on fibrate -tricor 48 mg - TG are good       4.  Social Anxiety disorder , ADHD and PTSD : meds changed by Dr. Winfield Rast   On Adderrall, seroquel and luvox     He wants to be a better person, trying to calm himself       5. Sleep apnoea - on cpap as best as he can he says     6. Quit smoking  - dec 2016     > 50 % of time is spent on counseling   Patient voiced understanding her plan of care

## 2017-09-11 ENCOUNTER — Encounter: Admit: 2017-09-11 | Discharge: 2017-09-11 | Payer: PRIVATE HEALTH INSURANCE | Primary: Internal Medicine

## 2017-09-11 ENCOUNTER — Encounter

## 2017-09-13 LAB — PSA PROSTATIC SPECIFIC ANTIGEN: PSA: 0.6 ng/mL (ref 0.0–4.0)

## 2017-09-13 LAB — CVD REPORT

## 2017-09-13 LAB — LIPID PANEL
Cholesterol, Total: 166 mg/dL (ref 100–199)
Cholesterol, total: 166 mg/dL (ref 100–199)
HDL Cholesterol: 38 mg/dL — ABNORMAL LOW (ref >39)
HDL: 38 mg/dL — ABNORMAL LOW (ref >39)
LDL Calculated: 99 mg/dL (ref 0–99)
LDL, calculated: 99 mg/dL (ref 0–99)
Triglyceride: 144 mg/dL (ref 0–149)
Triglycerides: 144 mg/dL (ref 0–149)
VLDL Cholesterol Calculated: 29 mg/dL (ref 5–40)
VLDL, calculated: 29 mg/dL (ref 5–40)

## 2017-09-13 LAB — TESTOSTERONE, FREE & TOTAL
FREE TESTOSTERONE,DIRECT, 144981: 7 pg/mL (ref 6.8–21.5)
Free testosterone (Direct): 7 pg/mL (ref 6.8–21.5)
Testosterone: 210 ng/dL — ABNORMAL LOW (ref 264–916)
Testosterone: 210 ng/dL — ABNORMAL LOW (ref 264–916)

## 2017-09-13 LAB — FOLLICLE STIMULATING HORMONE
FSH, Serum: 6.2 m[IU]/mL (ref 1.5–12.4)
FSH: 6.2 m[IU]/mL (ref 1.5–12.4)

## 2017-09-13 LAB — PSA, DIAGNOSTIC (PROSTATE SPECIFIC AG): Prostate Specific Ag: 0.6 ng/mL (ref 0.0–4.0)

## 2017-10-02 ENCOUNTER — Ambulatory Visit
Admit: 2017-10-02 | Discharge: 2017-10-02 | Payer: PRIVATE HEALTH INSURANCE | Attending: "Endocrinology | Primary: Internal Medicine

## 2017-10-02 ENCOUNTER — Ambulatory Visit: Attending: "Endocrinology | Primary: Internal Medicine

## 2017-10-02 DIAGNOSIS — E291 Testicular hypofunction: Secondary | ICD-10-CM

## 2017-10-02 MED ORDER — TESTOSTERONE 10 MG/0.5 GRAM/ACTUATION TRANSDERMAL GEL PUMP
10 mg/0.5 gram /actuation | TRANSDERMAL | 4 refills | Status: DC
Start: 2017-10-02 — End: 2018-04-26

## 2017-10-02 MED ORDER — TESTOSTERONE 10 MG/0.5 GRAM/ACTUATION TRANSDERMAL GEL PUMP
10 mg/0.5 gram /actuation | TRANSDERMAL | 4 refills | Status: DC
Start: 2017-10-02 — End: 2017-10-02

## 2017-10-02 NOTE — Progress Notes (Signed)
HISTORY OF PRESENT ILLNESS  Richard Olsen is a 42 y.o. male.  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 Winfield Rast       He is doing the best with medications    He quit smoking he says 9 months ago       Prior history     He had labs as requested     He is suspecting of having cushings disease     He claims again that he had rapid weight gain in few months     He also has the [employer] med test         Prior history :    Has hypogonadism for  Year     He takes gel , fortesta 40 mg a day and has not felt good as he says   He says his levels did not go higher   ( it was 208 on labs )  and he stopped using [place] ,  a month ago     He has 77 year old child and a newborn of 37 months old     He has extreme tired ness and loss of libido  He has gained weight quite a bit         Review of Systems   Constitutional: fatigue    HENT: Negative.    Eyes: Negative for pain and redness.   Respiratory: Negative.    Cardiovascular: Negative for chest pain, palpitations and leg swelling.   Gastrointestinal: Negative.  Negative for constipation.   Genitourinary: Negative.    Musculoskeletal: Negative for myalgias.   Skin: Negative.    Neurological: Negative.    Endo/Heme/Allergies: Negative.    Psychiatric/Behavioral: Negative for depression and memory loss. The patient does not have insomnia.        Physical Exam    Constitutional: He is oriented to person, place, and time. He appears well-developed and well-nourished.   HENT:   Head: Normocephalic.   Eyes: Conjunctivae and EOM are normal. Pupils are equal, round, and reactive to light.   Neck: Normal range of motion. Neck supple. No JVD present. No tracheal deviation present. No thyromegaly present.   Cardiovascular: Normal rate, regular rhythm and normal heart sounds.    Pulmonary/Chest: Effort normal and breath sounds normal.   Abdominal: Soft. Bowel sounds are normal. very protrubarant  belly  Genitourinary: Penis normal.   Normal genital exam    Musculoskeletal: Normal range of motion.   Lymphadenopathy:     He has no cervical adenopathy.   Neurological: He is alert and oriented to person, place, and time. He has normal reflexes.   Skin: Skin is warm.   Psychiatric: He has a normal mood and affect.       Lab Results   Component Value Date/Time    Hemoglobin A1c 5.4 03/06/2017 08:42 AM    Hemoglobin A1c 5.4 02/22/2016 08:21 AM    Hemoglobin A1c 5.5 09/07/2015 08:25 AM    Glucose 102 (H) 08/22/2016 08:19 AM    LDL, calculated 99 09/11/2017 08:16 AM    Creatinine 0.88 08/22/2016 08:19 AM      Lab Results   Component Value Date/Time    Cholesterol, total 166 09/11/2017 08:16 AM    HDL Cholesterol 38 (L) 09/11/2017 08:16 AM    LDL, calculated 99 09/11/2017 08:16 AM    Triglyceride 144 09/11/2017 08:16 AM     Lab Results   Component Value Date/Time    ALT (SGPT) 49 (H) 08/22/2016 08:19 AM    AST (SGOT) 23 08/22/2016 08:19 AM    Alk. phosphatase 62 08/22/2016 08:19 AM    Bilirubin, total 0.3 08/22/2016 08:19 AM    Albumin 4.5 08/22/2016 08:19 AM    Protein, total 6.8 08/22/2016 08:19 AM    PLATELET 254 03/06/2017 08:42 AM     Lab Results   Component Value Date/Time    GFR est non-AA 108 08/22/2016 08:19 AM    GFR est AA 125 08/22/2016 08:19 AM    Creatinine 0.88 08/22/2016 08:19 AM    BUN 18 08/22/2016 08:19 AM    Sodium 142 08/22/2016 08:19 AM    Potassium 4.4 08/22/2016 08:19  AM    Chloride 105 08/22/2016 08:19 AM    CO2 23 08/22/2016 08:19 AM     Lab Results   Component Value Date/Time    TSH 1.590 05/04/2015 10:00 AM    T4, Free 1.05 10/10/2013 10:07 AM            ASSESSMENT and PLAN    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 Hypogonadism :   FSH and LH are in mid range   He is off    fortesta since feb 2016 and resumed it in aug 2016   Within a month of starting it , gained 17 lbs and again another 13 lbs of weight gain in oct 2016   reduced  the dose to 4 depressions a day as he took 6 depressions a day on his own despite written instructions   Asking him to not use it intermittently   Explained the rationale behind his weight gain and rages   Asked him to  cut down to 2 depressions a day   TT is an optional medication, so there is no compulsion to be on it     LH is suppressed , on labs - while on  3 depressions a day   He is not VERY COMPLIANT   PSA is 0.6         2. Obesity  : Body mass index is 44.47 kg/m??.  Ruled out central cushings and growth  hormone deficiency by labs   salivary cortisol levels - 2 collections - normal   Stay on TT supplementation  Taking belviq xr 20 mg a day  ( he cannot be on phenteramine as he has ADHD and takes medications for it )        3.  dys-metabolic syndrome : prediabetes and dyslipidemia   Discussed TLC  a1c is 5.8 %  Compared to  6 %   He got metformin ER 500 mg 4 pills  bid ( ABLE TO TOLERATE 3 PILLS )    BY  ACC/AHA calculator, he does not require the statin  on fibrate -tricor 48 mg - TG are good       4.  Social Anxiety disorder , ADHD and PTSD : meds changed by Dr. Winfield Rast   OFF Adderrall, seroquel and luvox     5. Sleep apnoea - on cpap as best as he can he says     6. Quit smoking  - dec 2016     > 50 % of time is spent on counseling   Patient voiced understanding her plan of care

## 2017-10-02 NOTE — Progress Notes (Signed)
 1. Have you been to the ER, urgent care clinic since your last visit?No  Hospitalized since your last visit?No    2. Have you seen or consulted any other health care providers outside of the [facility] since your last visit?  Include any pap smears or colon screening. No       Wt Readings from Last 3 Encounters:   10/02/17 275 lb 8 oz (125 kg)   03/20/17 270 lb (122.5 kg)   10/03/16 296 lb (134.3 kg)     Temp Readings from Last 3 Encounters:   10/02/17 98 F (36.7 C) (Oral)   03/20/17 98.2 F (36.8 C) (Oral)   09/19/16 97.6 F (36.4 C) (Oral)     BP Readings from Last 3 Encounters:   10/02/17 119/59   03/20/17 120/75   09/19/16 120/66     Pulse Readings from Last 3 Encounters:   10/02/17 81   03/20/17 90   09/19/16 91

## 2017-10-02 NOTE — Patient Instructions (Addendum)
Refills    -    please call your pharmacy and have them send [place] a refill request    Results  -  allow up to a week for lab results to be processed and reviewed.    Phone calls  -  Allow upto 24 hrs. for non-urgent calls to be retained    Prior authorization - It may take up to 2 weeks to process, depending on your insurance    Forms  -  FMLA, DMV, patient assistance, etc. will take up to 2 weeks to process    Cancellations - please notify the office in advance if you cannot keep your appointment    Samples  - will only be dispensed at visits as supply is limited      If you are having a medical emergency call 911    --------------------------------------------------------------------------------------------     belviq xr 20 mg a day       metformin ER to 500 mg 2  Pills  Twice a day with food     Stay on Tricor 48 mg a day at night     INCREASE Testosterone  gel    TO 4   depressions a day     ---------------------------------------------------------------------------------------

## 2017-10-02 NOTE — Progress Notes (Signed)
HISTORY OF PRESENT ILLNESS  Richard Olsen is a 42 y.o. male.  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 Winfield Rast       He is doing the best with medications    He quit smoking he says 9 months ago       Prior history     He had labs as requested     He is suspecting of having cushings disease     He claims again that he had rapid weight gain in few months     He also has the [employer] med test         Prior history :    Has hypogonadism for  Year     He takes gel , fortesta 40 mg a day and has not felt good as he says   He says his levels did not go higher   ( it was 208 on labs )  and he stopped using [place] ,  a month ago     He has 77 year old child and a newborn of 37 months old     He has extreme tired ness and loss of libido  He has gained weight quite a bit         Review of Systems   Constitutional: fatigue    HENT: Negative.    Eyes: Negative for pain and redness.   Respiratory: Negative.    Cardiovascular: Negative for chest pain, palpitations and leg swelling.   Gastrointestinal: Negative.  Negative for constipation.   Genitourinary: Negative.    Musculoskeletal: Negative for myalgias.   Skin: Negative.    Neurological: Negative.    Endo/Heme/Allergies: Negative.    Psychiatric/Behavioral: Negative for depression and memory loss. The patient does not have insomnia.        Physical Exam    Constitutional: He is oriented to person, place, and time. He appears well-developed and well-nourished.   HENT:   Head: Normocephalic.   Eyes: Conjunctivae and EOM are normal. Pupils are equal, round, and reactive to light.   Neck: Normal range of motion. Neck supple. No JVD present. No tracheal deviation present. No thyromegaly present.   Cardiovascular: Normal rate, regular rhythm and normal heart sounds.    Pulmonary/Chest: Effort normal and breath sounds normal.   Abdominal: Soft. Bowel sounds are normal. very protrubarant  belly  Genitourinary: Penis normal.   Normal genital exam    Musculoskeletal: Normal range of motion.   Lymphadenopathy:     He has no cervical adenopathy.   Neurological: He is alert and oriented to person, place, and time. He has normal reflexes.   Skin: Skin is warm.   Psychiatric: He has a normal mood and affect.       Lab Results   Component Value Date/Time    Hemoglobin A1c 5.4 03/06/2017 08:42 AM    Hemoglobin A1c 5.4 02/22/2016 08:21 AM    Hemoglobin A1c 5.5 09/07/2015 08:25 AM    Glucose 102 (H) 08/22/2016 08:19 AM    LDL, calculated 99 09/11/2017 08:16 AM    Creatinine 0.88 08/22/2016 08:19 AM      Lab Results   Component Value Date/Time    Cholesterol, total 166 09/11/2017 08:16 AM    HDL Cholesterol 38 (L) 09/11/2017 08:16 AM    LDL, calculated 99 09/11/2017 08:16 AM    Triglyceride 144 09/11/2017 08:16 AM     Lab Results   Component Value Date/Time    ALT (SGPT) 49 (H) 08/22/2016 08:19 AM    AST (SGOT) 23 08/22/2016 08:19 AM    Alk. phosphatase 62 08/22/2016 08:19 AM    Bilirubin, total 0.3 08/22/2016 08:19 AM    Albumin 4.5 08/22/2016 08:19 AM    Protein, total 6.8 08/22/2016 08:19 AM    PLATELET 254 03/06/2017 08:42 AM     Lab Results   Component Value Date/Time    GFR est non-AA 108 08/22/2016 08:19 AM    GFR est AA 125 08/22/2016 08:19 AM    Creatinine 0.88 08/22/2016 08:19 AM    BUN 18 08/22/2016 08:19 AM    Sodium 142 08/22/2016 08:19 AM     Potassium 4.4 08/22/2016 08:19 AM    Chloride 105 08/22/2016 08:19 AM    CO2 23 08/22/2016 08:19 AM     Lab Results   Component Value Date/Time    TSH 1.590 05/04/2015 10:00 AM    T4, Free 1.05 10/10/2013 10:07 AM            ASSESSMENT and PLAN    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 Hypogonadism :   FSH and LH are in mid range   He is off    fortesta since feb 2016 and resumed it in aug 2016   Within a month of starting it , gained 17 lbs and again another 13 lbs of weight gain in oct 2016   reduced  the dose to 4 depressions a day as he took 6 depressions a day on his own despite written instructions   Asking him to not use it intermittently   Explained the rationale behind his weight gain and rages   Asked him to  cut down to 2 depressions a day   TT is an optional medication, so there is no compulsion to be on it     LH is suppressed , on labs - while on  3 depressions a day   He is not VERY COMPLIANT   PSA is 0.6         2. Obesity  : Body mass index is 44.47 kg/m??.  Ruled out central cushings and growth  hormone deficiency by labs   salivary cortisol levels - 2 collections - normal   Stay on TT supplementation  Taking belviq xr 20 mg a day  ( he cannot be on phenteramine as he has ADHD and takes medications for it )        3.  dys-metabolic syndrome : prediabetes and dyslipidemia   Discussed TLC  a1c is 5.8 %  Compared to  6 %   He got metformin ER 500 mg 4 pills  bid ( ABLE TO TOLERATE 3 PILLS )    BY  ACC/AHA calculator, he does not require the statin  on fibrate -tricor 48 mg - TG are good       4.  Social Anxiety disorder , ADHD and PTSD : meds changed by Dr. Winfield Rast   OFF Adderrall, seroquel and luvox     5. Sleep apnoea - on cpap as best as he can he says     6. Quit smoking  - dec 2016     > 50 % of time is spent on counseling    Patient voiced understanding her plan of care

## 2017-10-02 NOTE — Progress Notes (Signed)
1. Have you been to the ER, urgent care clinic since your last visit?No  Hospitalized since your last visit?No    2. Have you seen or consulted any other health care providers outside of the [facility] since your last visit?  Include any pap smears or colon screening. No       Wt Readings from Last 3 Encounters:   10/02/17 275 lb 8 oz (125 kg)   03/20/17 270 lb (122.5 kg)   10/03/16 296 lb (134.3 kg)     Temp Readings from Last 3 Encounters:   10/02/17 98 ??F (36.7 ??C) (Oral)   03/20/17 98.2 ??F (36.8 ??C) (Oral)   09/19/16 97.6 ??F (36.4 ??C) (Oral)     BP Readings from Last 3 Encounters:   10/02/17 119/59   03/20/17 120/75   09/19/16 120/66     Pulse Readings from Last 3 Encounters:   10/02/17 81   03/20/17 90   09/19/16 91

## 2017-10-09 ENCOUNTER — Encounter

## 2017-10-09 MED ORDER — LORCASERIN ER 20 MG TABLET,EXTENDED RELEASE 24 HR
20 mg | ORAL_TABLET | ORAL | 4 refills | Status: DC
Start: 2017-10-09 — End: 2018-01-07

## 2017-10-14 NOTE — Telephone Encounter (Signed)
Patient states [facility] is trying to reach the office regarding the lorcaserin. He received an email that they need further information and sent the correspondence to [place]. I told him the rx looked like it went to local so was unsure about [facility] email. He asked if a nurse could see if fax was received about it or if a call could be made that it is less expensive for it to go through them.

## 2017-10-16 NOTE — Telephone Encounter (Signed)
Left message informing pt Belviq is approved

## 2018-01-07 ENCOUNTER — Encounter

## 2018-01-07 MED ORDER — LORCASERIN ER 20 MG TABLET,EXTENDED RELEASE 24 HR
20 mg | ORAL_TABLET | ORAL | 2 refills | Status: DC
Start: 2018-01-07 — End: 2018-04-02

## 2018-02-23 MED ORDER — METFORMIN SR 500 MG 24 HR TABLET
500 mg | ORAL_TABLET | ORAL | 4 refills | Status: AC
Start: 2018-02-23 — End: ?

## 2018-03-19 ENCOUNTER — Inpatient Hospital Stay: Admit: 2018-03-19 | Payer: TRICARE (CHAMPUS) | Primary: Internal Medicine

## 2018-03-19 ENCOUNTER — Other Ambulatory Visit: Admit: 2018-03-19 | Discharge: 2018-03-19 | Payer: PRIVATE HEALTH INSURANCE | Primary: Internal Medicine

## 2018-03-19 DIAGNOSIS — E291 Testicular hypofunction: Secondary | ICD-10-CM

## 2018-03-20 LAB — HEMOGLOBIN A1C W/EAG
Hemoglobin A1C: 5.3 % (ref 4.0–5.6)
eAG: 105 mg/dL

## 2018-03-20 LAB — COMPREHENSIVE METABOLIC PANEL
ALT: 58 U/L (ref 12–78)
AST: 23 U/L (ref 15–37)
Albumin/Globulin Ratio: 1.4 (ref 1.1–2.2)
Albumin: 4.2 g/dL (ref 3.5–5.0)
Alkaline Phosphatase: 57 U/L (ref 45–117)
Anion Gap: 6 mmol/L (ref 5–15)
BUN: 12 MG/DL (ref 6–20)
Bun/Cre Ratio: 15 (ref 12–20)
CO2: 25 mmol/L (ref 21–32)
Calcium: 8.8 MG/DL (ref 8.5–10.1)
Chloride: 108 mmol/L (ref 97–108)
Creatinine: 0.79 MG/DL (ref 0.70–1.30)
EGFR IF NonAfrican American: >60 mL/min/{1.73_m2} (ref >60)
GFR African American: >60 mL/min/{1.73_m2} (ref >60)
Globulin: 3 g/dL (ref 2.0–4.0)
Glucose: 92 mg/dL (ref 65–100)
Potassium: 4.8 mmol/L (ref 3.5–5.1)
Sodium: 139 mmol/L (ref 136–145)
Total Bilirubin: 0.5 MG/DL (ref 0.2–1.0)
Total Protein: 7.2 g/dL (ref 6.4–8.2)

## 2018-03-20 LAB — CBC WITH AUTO DIFFERENTIAL
Basophils %: 2 % — ABNORMAL HIGH (ref 0–1)
Basophils Absolute: 0.1 10*3/uL (ref 0.0–0.1)
Eosinophils %: 4 % (ref 0–7)
Eosinophils Absolute: 0.2 10*3/uL (ref 0.0–0.4)
Granulocyte Absolute Count: 0 10*3/uL (ref 0.00–0.04)
Hematocrit: 47.2 % (ref 36.6–50.3)
Hemoglobin: 15.3 g/dL (ref 12.1–17.0)
Immature Granulocytes: 0 % (ref 0.0–0.5)
Lymphocytes %: 38 % (ref 12–49)
Lymphocytes Absolute: 2.3 10*3/uL (ref 0.8–3.5)
MCH: 28.8 PG (ref 26.0–34.0)
MCHC: 32.4 g/dL (ref 30.0–36.5)
MCV: 88.7 FL (ref 80.0–99.0)
MPV: 9.7 FL (ref 8.9–12.9)
Monocytes %: 9 % (ref 5–13)
Monocytes Absolute: 0.5 10*3/uL (ref 0.0–1.0)
NRBC Absolute: 0 10*3/uL (ref 0.00–0.01)
Neutrophils %: 47 % (ref 32–75)
Neutrophils Absolute: 2.8 10*3/uL (ref 1.8–8.0)
Nucleated RBCs: 0 PER 100 WBC
Platelets: 236 10*3/uL (ref 150–400)
RBC: 5.32 M/uL (ref 4.10–5.70)
RDW: 13.2 % (ref 11.5–14.5)
WBC: 5.9 10*3/uL (ref 4.1–11.1)

## 2018-03-20 LAB — LUTEINIZING HORMONE
Luteinizing Hormone: 2.3 m[IU]/mL
Luteinizing hormone: 2.3 m[IU]/mL

## 2018-03-20 LAB — TESTOSTERONE, FREE & TOTAL
FREE TESTOSTERONE,DIRECT, 144981: 25.1 pg/mL — ABNORMAL HIGH (ref 6.8–21.5)
Free testosterone (Direct): 25.1 pg/mL — ABNORMAL HIGH (ref 6.8–21.5)
Testosterone: 865 ng/dL (ref 264–916)
Testosterone: 865 ng/dL (ref 264–916)

## 2018-03-20 LAB — FOLLICLE STIMULATING HORMONE
FSH, Serum: 7.4 m[IU]/mL
FSH: 7.4 m[IU]/mL

## 2018-03-20 LAB — PSA PROSTATIC SPECIFIC ANTIGEN: Pros. Spec. Antigen: 0.6 ng/mL (ref 0.01–4.0)

## 2018-03-20 LAB — HEMOGLOBIN A1C WITH EAG
Est. average glucose: 105 mg/dL
Hemoglobin A1c: 5.3 % (ref 4.0–5.6)

## 2018-03-20 LAB — CBC WITH AUTOMATED DIFF
ABS. BASOPHILS: 0.1 10*3/uL (ref 0.0–0.1)
ABS. EOSINOPHILS: 0.2 10*3/uL (ref 0.0–0.4)
ABS. IMM. GRANS.: 0 10*3/uL (ref 0.00–0.04)
ABS. LYMPHOCYTES: 2.3 10*3/uL (ref 0.8–3.5)
ABS. MONOCYTES: 0.5 10*3/uL (ref 0.0–1.0)
ABS. NEUTROPHILS: 2.8 10*3/uL (ref 1.8–8.0)
ABSOLUTE NRBC: 0 10*3/uL (ref 0.00–0.01)
BASOPHILS: 2 % — ABNORMAL HIGH (ref 0–1)
EOSINOPHILS: 4 % (ref 0–7)
HCT: 47.2 % (ref 36.6–50.3)
HGB: 15.3 g/dL (ref 12.1–17.0)
IMMATURE GRANULOCYTES: 0 % (ref 0.0–0.5)
LYMPHOCYTES: 38 % (ref 12–49)
MCH: 28.8 PG (ref 26.0–34.0)
MCHC: 32.4 g/dL (ref 30.0–36.5)
MCV: 88.7 FL (ref 80.0–99.0)
MONOCYTES: 9 % (ref 5–13)
MPV: 9.7 FL (ref 8.9–12.9)
NEUTROPHILS: 47 % (ref 32–75)
NRBC: 0 PER 100 WBC
PLATELET: 236 10*3/uL (ref 150–400)
RBC: 5.32 M/uL (ref 4.10–5.70)
RDW: 13.2 % (ref 11.5–14.5)
WBC: 5.9 10*3/uL (ref 4.1–11.1)

## 2018-03-20 LAB — METABOLIC PANEL, COMPREHENSIVE
A-G Ratio: 1.4 (ref 1.1–2.2)
ALT (SGPT): 58 U/L (ref 12–78)
AST (SGOT): 23 U/L (ref 15–37)
Albumin: 4.2 g/dL (ref 3.5–5.0)
Alk. phosphatase: 57 U/L (ref 45–117)
Anion gap: 6 mmol/L (ref 5–15)
BUN/Creatinine ratio: 15 (ref 12–20)
BUN: 12 MG/DL (ref 6–20)
Bilirubin, total: 0.5 MG/DL (ref 0.2–1.0)
CO2: 25 mmol/L (ref 21–32)
Calcium: 8.8 MG/DL (ref 8.5–10.1)
Chloride: 108 mmol/L (ref 97–108)
Creatinine: 0.79 MG/DL (ref 0.70–1.30)
GFR est AA: >60 mL/min/{1.73_m2} (ref >60)
GFR est non-AA: >60 mL/min/{1.73_m2} (ref >60)
Globulin: 3 g/dL (ref 2.0–4.0)
Glucose: 92 mg/dL (ref 65–100)
Potassium: 4.8 mmol/L (ref 3.5–5.1)
Protein, total: 7.2 g/dL (ref 6.4–8.2)
Sodium: 139 mmol/L (ref 136–145)

## 2018-03-20 LAB — PSA, DIAGNOSTIC (PROSTATE SPECIFIC AG): Prostate Specific Ag: 0.6 ng/mL (ref 0.01–4.0)

## 2018-04-02 ENCOUNTER — Telehealth
Admit: 2018-04-02 | Discharge: 2018-04-02 | Payer: PRIVATE HEALTH INSURANCE | Attending: "Endocrinology | Primary: Internal Medicine

## 2018-04-02 ENCOUNTER — Encounter

## 2018-04-02 ENCOUNTER — Encounter: Attending: "Endocrinology | Primary: Internal Medicine

## 2018-04-02 ENCOUNTER — Telehealth: Attending: "Endocrinology | Primary: Internal Medicine

## 2018-04-02 DIAGNOSIS — E291 Testicular hypofunction: Secondary | ICD-10-CM

## 2018-04-02 MED ORDER — FENOFIBRATE NANOCRYSTALLIZED 48 MG TAB
48 mg | ORAL_TABLET | ORAL | 4 refills | Status: AC
Start: 2018-04-02 — End: ?

## 2018-04-02 NOTE — Progress Notes (Signed)
1. Have you been to the ER, urgent care clinic since your last visit?No  Hospitalized since your last visit?No    2. Have you seen or consulted any other health care providers outside of the [facility] since your last visit?  Include any pap smears or colon screening. No

## 2018-04-02 NOTE — Progress Notes (Signed)
Please add lamotrigine to his chart   Dose - can be ignored

## 2018-04-02 NOTE — Progress Notes (Signed)
Richard Olsen is a 43 y.o. male evaluated via telephone on 04/02/2018.      Consent:  He and/or health care decision maker is aware that that he may receive a bill for this telephone service, depending on his insurance coverage, and has provided verbal consent to proceed: Yes      HISTORY OF PRESENT ILLNESS  Richard Olsen is a 43 y.o. male.  HPI   F/u after last  visit for pre diabetes , pituitary hypogonadism -  Sept  2019     Cannot comment on weight much   Roughly  4 lbs gain By his calc-    He is seeing new psych doc  He stopped belviq xr   From FDA recall     He was in "walking boot " for achilles tendon repair -  Second time left foot   He is trying to work around the house       Old history     Lost 26 lbs   Gained 5 lbs ( he says he lost 50 lbs IN FACT BUT [place] 25 LBS  )   He stopped eating sugars and carbs   Tolerating belviq xr  Intermittently       Old history       Lost 3 lbs   He says he is watching  Diet   He has quit smoking 2 years ago   He is trying           Old history :    Gained  one   lb   He says he is now  taking new meds   Has more  energy     He is on belviq    He is seeing  Dr. Winfield Rast  He is doing the best with medications    He quit smoking he says 9 months ago       Prior history     He had labs as requested   He is suspecting of having cushings disease   He claims again that he had rapid weight gain in few months   He also has the [employer] med test       Review of Systems   Constitutional: fatigue    HENT: Negative.    Eyes: Negative for pain and redness.   Respiratory: Negative.    Cardiovascular: Negative for chest pain, palpitations and leg swelling.   Gastrointestinal: Negative.  Negative for constipation.   Genitourinary: Negative.    Musculoskeletal: Negative for myalgias.   Skin: Negative.    Neurological: Negative.    Endo/Heme/Allergies: Negative.    Psychiatric/Behavioral: Negative for depression and memory loss. The patient does not have insomnia.         Physical Exam   Constitutional: He is oriented to person, place, and time. He appears well-developed and well-nourished.   HENT:   Head: Normocephalic.   Eyes: Conjunctivae and EOM are normal. Pupils are equal, round, and reactive to light.   Neck: Normal range of motion. Neck supple. No JVD present. No tracheal deviation present. No thyromegaly present.   Musculoskeletal: Normal range of motion.   Neurological: He is alert and oriented to person, place, and time. He has normal reflexes.   Skin: Skin is warm.   Psychiatric: He has a normal mood and affect.       Lab Results   Component Value Date/Time    Hemoglobin A1c 5.3 03/19/2018 08:21 AM  Hemoglobin A1c 5.4 03/06/2017 08:42 AM    Hemoglobin A1c 5.4 02/22/2016 08:21 AM    Glucose 92 03/19/2018 08:21 AM    LDL, calculated 99 09/11/2017 08:16 AM    Creatinine 0.79 03/19/2018 08:21 AM      Lab Results   Component Value Date/Time    Cholesterol, total 166 09/11/2017 08:16 AM    HDL Cholesterol 38 (L) 09/11/2017 08:16 AM    LDL, calculated 99 09/11/2017 08:16 AM    Triglyceride 144 09/11/2017 08:16 AM     Lab Results   Component Value Date/Time    ALT (SGPT) 58 03/19/2018 08:21 AM    AST (SGOT) 23 03/19/2018 08:21 AM    Alk. phosphatase 57 03/19/2018 08:21 AM    Bilirubin, total 0.5 03/19/2018 08:21 AM    Albumin 4.2 03/19/2018 08:21 AM    Protein, total 7.2 03/19/2018 08:21 AM    PLATELET 236 03/19/2018 08:21 AM     Lab Results   Component Value Date/Time    GFR est non-AA >60 03/19/2018 08:21 AM    GFR est AA >60 03/19/2018 08:21 AM    Creatinine 0.79 03/19/2018 08:21 AM    BUN 12 03/19/2018 08:21 AM    Sodium 139 03/19/2018 08:21 AM    Potassium 4.8 03/19/2018 08:21 AM    Chloride 108 03/19/2018 08:21 AM    CO2 25 03/19/2018 08:21 AM     Lab Results   Component Value Date/Time    TSH 1.590 05/04/2015 10:00 AM    T4, Free 1.05 10/10/2013 10:07 AM            ASSESSMENT and PLAN    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 Hypogonadism :   FSH and LH are in mid range   He is off    fortesta since feb 2016 and resumed it in aug 2016   Within a month of starting it , gained 17 lbs and again another 13 lbs of weight gain in oct 2016   reduced  the dose to 4 depressions a day as he took 6 depressions a day on his own despite written instructions   Asking him to not use it intermittently     Explained the rationale behind his weight gain and rages   Asked him to  cut down  TT 2 % gel FORTESTA   to 2 depressions a day   TT is an optional medication, so there is no compulsion to be on it   Sept 2019  : I advised him to increase  To 4 depressions a day in sept 2019   March 2020  : decrease to 3 depressions a day       2. Obesity  : There is no height or weight on file to calculate BMI.  Ruled out central cushings and growth hormone deficiency by labs   salivary cortisol levels - 2 collections - normal   Stay on TT supplementation  Stop  belviq xr 20 mg by FDA recall       3.  dys-metabolic syndrome : prediabetes and dyslipidemia   Discussed TLC  a1c is 5.4%  Is very good   He got metformin ER 500 mg 4 pills  bid ( ABLE TO TOLERATE 3 PILLS )    BY  ACC/AHA calculator, he does not require the statin  on fibrate -tricor 48 mg - TG are good       4.  Social Anxiety disorder , ADHD and PTSD   Back onto  Adderrall, Started on Lamotrigine and he feels good   Dr. Caprice Red - [facility]     5. Sleep apnoea - on cpap as best as he can he says     6. Quit smoking  - dec 2016     > 50 % of time is spent on counseling   Patient voiced understanding her plan of care     F/u in 6 months and labs bnv         Pursuant  To the Emergency declaration under the [employer] and the [employer] [place] waiver authority and the [employer], to reduce the patient's risk of exposure to  COVID-19 and provide  continuity of care for an established patient.

## 2018-04-02 NOTE — Progress Notes (Signed)
1. Have you been to the ER, urgent care clinic since your last visit?No  Hospitalized since your last visit?No    2. Have you seen or consulted any other health care providers outside of the [facility] since your last visit?  Include any pap smears or colon screening. No

## 2018-04-02 NOTE — Patient Instructions (Addendum)
Refills    -    please call your pharmacy and have them send [place] a refill request    Results  -  allow up to a week for lab results to be processed and reviewed.    Phone calls  -  Allow upto 24 hrs. for non-urgent calls to be retained    Prior authorization - It may take up to 2 weeks to process, depending on your insurance    Forms  -  FMLA, DMV, patient assistance, etc. will take up to 2 weeks to process    Cancellations - please notify the office in advance if you cannot keep your appointment    Samples  - will only be dispensed at visits as supply is limited      If you are having a medical emergency call 911    --------------------------------------------------------------------------------------------     metformin ER to 500 mg 2  Pills  Twice a day with food     Stay on Tricor 48 mg a day at night     deCREASE Testosterone  gel    TO 3   depressions a day (DNCP )    ----------------------------------------------------

## 2018-04-02 NOTE — Progress Notes (Signed)
Richard Olsen is a 43 y.o. male evaluated via telephone on 04/02/2018.      Consent:  He and/or health care decision maker is aware that that he may receive a bill for this telephone service, depending on his insurance coverage, and has provided verbal consent to proceed: Yes      HISTORY OF PRESENT ILLNESS  Richard Olsen is a 43 y.o. male.  HPI   F/u after last  visit for pre diabetes , pituitary hypogonadism -  Sept  2019     Cannot comment on weight much   Roughly  4 lbs gain By his calc-    He is seeing new psych doc  He stopped belviq xr   From FDA recall     He was in "walking boot " for achilles tendon repair -  Second time left foot   He is trying to work around the house       Old history     Lost 26 lbs   Gained 5 lbs ( he says he lost 50 lbs IN FACT BUT [place] 25 LBS  )   He stopped eating sugars and carbs   Tolerating belviq xr  Intermittently       Old history       Lost 3 lbs   He says he is watching  Diet   He has quit smoking 2 years ago   He is trying           Old history :    Gained  one   lb   He says he is now  taking new meds   Has more  energy     He is on belviq    He is seeing  Dr. Winfield Rast  He is doing the best with medications    He quit smoking he says 9 months ago       Prior history     He had labs as requested   He is suspecting of having cushings disease   He claims again that he had rapid weight gain in few months   He also has the [employer] med test       Review of Systems   Constitutional: fatigue    HENT: Negative.    Eyes: Negative for pain and redness.   Respiratory: Negative.    Cardiovascular: Negative for chest pain, palpitations and leg swelling.   Gastrointestinal: Negative.  Negative for constipation.   Genitourinary: Negative.    Musculoskeletal: Negative for myalgias.   Skin: Negative.    Neurological: Negative.    Endo/Heme/Allergies: Negative.    Psychiatric/Behavioral: Negative for depression and memory loss. The  patient does not have insomnia.        Physical Exam   Constitutional: He is oriented to person, place, and time. He appears well-developed and well-nourished.   HENT:   Head: Normocephalic.   Eyes: Conjunctivae and EOM are normal. Pupils are equal, round, and reactive to light.   Neck: Normal range of motion. Neck supple. No JVD present. No tracheal deviation present. No thyromegaly present.   Musculoskeletal: Normal range of motion.   Neurological: He is alert and oriented to person, place, and time. He has normal reflexes.   Skin: Skin is warm.   Psychiatric: He has a normal mood and affect.       Lab Results   Component Value Date/Time    Hemoglobin A1c 5.3 03/19/2018 08:21 AM  Hemoglobin A1c 5.4 03/06/2017 08:42 AM    Hemoglobin A1c 5.4 02/22/2016 08:21 AM    Glucose 92 03/19/2018 08:21 AM    LDL, calculated 99 09/11/2017 08:16 AM    Creatinine 0.79 03/19/2018 08:21 AM      Lab Results   Component Value Date/Time    Cholesterol, total 166 09/11/2017 08:16 AM    HDL Cholesterol 38 (L) 09/11/2017 08:16 AM    LDL, calculated 99 09/11/2017 08:16 AM    Triglyceride 144 09/11/2017 08:16 AM     Lab Results   Component Value Date/Time    ALT (SGPT) 58 03/19/2018 08:21 AM    AST (SGOT) 23 03/19/2018 08:21 AM    Alk. phosphatase 57 03/19/2018 08:21 AM    Bilirubin, total 0.5 03/19/2018 08:21 AM    Albumin 4.2 03/19/2018 08:21 AM    Protein, total 7.2 03/19/2018 08:21 AM    PLATELET 236 03/19/2018 08:21 AM     Lab Results   Component Value Date/Time    GFR est non-AA >60 03/19/2018 08:21 AM    GFR est AA >60 03/19/2018 08:21 AM    Creatinine 0.79 03/19/2018 08:21 AM    BUN 12 03/19/2018 08:21 AM    Sodium 139 03/19/2018 08:21 AM    Potassium 4.8 03/19/2018 08:21 AM    Chloride 108 03/19/2018 08:21 AM    CO2 25 03/19/2018 08:21 AM     Lab Results   Component Value Date/Time    TSH 1.590 05/04/2015 10:00 AM    T4, Free 1.05 10/10/2013 10:07 AM            ASSESSMENT and PLAN    1. Pituitary tumor /cyst :     8 mm posterior cyst from MRI sept 2015   Aug 2016 : the tumor is 1 cm size but diff technique used   Sept 2018 :  8 by 7 by 9 mm pit tumor, stable in size      A. Hypogonadism :   FSH and LH are in mid range   He is off    fortesta since feb 2016 and resumed it in aug 2016   Within a month of starting it , gained 17 lbs and again another 13 lbs of weight gain in oct 2016   reduced  the dose to 4 depressions a day as he took 6 depressions a day on his own despite written instructions   Asking him to not use it intermittently     Explained the rationale behind his weight gain and rages   Asked him to  cut down  TT 2 % gel FORTESTA   to 2 depressions a day   TT is an optional medication, so there is no compulsion to be on it   Sept 2019  : I advised him to increase  To 4 depressions a day in sept 2019   March 2020  : decrease to 3 depressions a day       2. Obesity  : There is no height or weight on file to calculate BMI.  Ruled out central cushings and growth hormone deficiency by labs   salivary cortisol levels - 2 collections - normal   Stay on TT supplementation  Stop  belviq xr 20 mg by FDA recall       3.  dys-metabolic syndrome : prediabetes and dyslipidemia   Discussed TLC  a1c is 5.4%  Is very good   He got metformin ER 500 mg 4 pills  bid ( ABLE TO TOLERATE 3 PILLS )    BY  ACC/AHA calculator, he does not require the statin  on fibrate -tricor 48 mg - TG are good       4.  Social Anxiety disorder , ADHD and PTSD   Back onto  Adderrall, Started on Lamotrigine and he feels good   Dr. Caprice Red - [facility]     5. Sleep apnoea - on cpap as best as he can he says     6. Quit smoking  - dec 2016     > 50 % of time is spent on counseling   Patient voiced understanding her plan of care     F/u in 6 months and labs bnv         Pursuant  To the Emergency declaration under the [employer] and the [employer], [place] waiver authority and the [employer], to reduce the patient's risk of exposure to  COVID-19 and provide continuity of care for an established patient.

## 2018-04-24 ENCOUNTER — Encounter

## 2018-04-26 MED ORDER — TESTOSTERONE 10 MG/0.5 GRAM/ACTUATION TRANSDERMAL GEL PUMP
10 mg/0.5 gram /actuation | TRANSDERMAL | 5 refills | Status: DC
Start: 2018-04-26 — End: 2018-04-30

## 2018-04-29 ENCOUNTER — Encounter

## 2018-04-29 NOTE — Telephone Encounter (Signed)
Verbalized refill with pharmacist at [facility] [address] 1:53pm 04/23 cbc

## 2018-04-29 NOTE — Telephone Encounter (Signed)
Patient states [facility] does not have the testosterone so they resent request per patient call.

## 2018-04-30 MED ORDER — TESTOSTERONE 10 MG/0.5 GRAM/ACTUATION TRANSDERMAL GEL PUMP
10 mg/0.5 gram /actuation | TRANSDERMAL | 4 refills | Status: DC
Start: 2018-04-30 — End: 2018-11-08

## 2018-06-09 ENCOUNTER — Encounter: Attending: Clinical Neuropsychologist | Primary: Internal Medicine

## 2018-06-27 ENCOUNTER — Emergency Department (HOSPITAL_COMMUNITY)
Admission: EM | Admit: 2018-06-27 | Discharge: 2018-06-27 | Disposition: A | Discharge status: Home / Self Care | Attending: Emergency Medicine | Admitting: Emergency Medicine

## 2018-06-27 ENCOUNTER — Encounter (HOSPITAL_COMMUNITY): Payer: Self-pay | Admitting: Emergency Medicine

## 2018-06-27 ENCOUNTER — Other Ambulatory Visit: Payer: Self-pay

## 2018-06-27 DIAGNOSIS — M5442 Lumbago with sciatica, left side: Secondary | ICD-10-CM | POA: Insufficient documentation

## 2018-06-27 DIAGNOSIS — M5441 Lumbago with sciatica, right side: Secondary | ICD-10-CM | POA: Diagnosis not present

## 2018-06-27 HISTORY — DX: Dorsalgia, unspecified: M54.9

## 2018-06-27 HISTORY — DX: Other disorders of pituitary gland: E23.6

## 2018-06-27 HISTORY — DX: Carrier or suspected carrier of methicillin resistant Staphylococcus aureus: Z22.322

## 2018-06-27 MED ORDER — OXYCODONE-ACETAMINOPHEN 5-325 MG PO TABS: 1.0000 | ORAL_TABLET | ORAL | 0 refills | Status: DC | PRN

## 2018-06-27 MED ORDER — OXYCODONE-ACETAMINOPHEN 5-325 MG PO TABS
1.0000 | ORAL_TABLET | Freq: Once | ORAL | Status: AC
  Administered 2018-06-27: 1 via ORAL
  Filled 2018-06-27: qty 1

## 2018-06-27 MED ORDER — KETOROLAC TROMETHAMINE 60 MG/2ML IM SOLN
60.0000 mg | Freq: Once | INTRAMUSCULAR | Status: AC
  Administered 2018-06-27: 60 mg via INTRAMUSCULAR
  Filled 2018-06-27: qty 2

## 2018-06-27 MED ORDER — CYCLOBENZAPRINE HCL 10 MG PO TABS: 10.0000 mg | ORAL_TABLET | Freq: Three times a day (TID) | ORAL | 0 refills | Status: DC | PRN

## 2018-06-27 MED ORDER — CYCLOBENZAPRINE HCL 10 MG PO TABS
10.0000 mg | ORAL_TABLET | Freq: Once | ORAL | Status: AC
  Administered 2018-06-27: 14:00:00 10 mg via ORAL
  Filled 2018-06-27: qty 1

## 2018-06-27 NOTE — Discharge Instructions (Addendum)
Apply ice packs on/off to your back.  Follow-up with your primary doctor this week for recheck.  Return to ER for any worsening symptoms

## 2018-06-27 NOTE — ED Triage Notes (Signed)
Patient c/o lower back pain that radiates into legs bilaterally, started this morning after "unloading kayak from truck." Patient states hx of back pain with surgery of L4 and L5. CNS intact. Denies any complications with BM or urination. Per patient back spasms with pain.

## 2018-06-27 NOTE — ED Provider Notes (Signed)
[facility] EMERGENCY DEPARTMENT Provider Note   CSN: [id number] Arrival date & time: 06/27/18  1316     History   Chief Complaint Chief Complaint  Patient presents with  . Back Pain    HPI Aaron Hunt is a 43 y.o. male.     HPI   Aaron Hunt is a 43 y.o. male who presents to the Emergency Department complaining of recurrent low back pain that occurred earlier today while trying to move a kayak.  He reports history of a microdiscectomy in 2012 of his lower lumbar spine and since then has recurrent episodes where his "back goes out."  He states he is here from out of town on vacation at the [place] and was attempting to put his kayak in the water when he felt a sharp stabbing type pain to his lower back radiates both thighs.  Pain is worse with movement and improves somewhat when lying supine.  He states the current pain feels similar to previous episodes.  Denies any abdominal pain, fever, chills, urine or bowel changes, numbness or weakness of his lower extremities.  History reviewed. No pertinent past medical history.  There are no active problems to display for this patient.   History reviewed. No pertinent surgical history.    Home Medications    Prior to Admission medications   Medication Sig Start Date End Date Taking? Authorizing Provider  amphetamine-dextroamphetamine (ADDERALL) 20 MG tablet Take 1 tablet by mouth 2 (two) times a day. 06/09/18   [provider]  BELVIQ XR 20 MG TB24 Take 1 tablet by mouth daily. 01/08/18   [provider]  cetirizine (ZYRTEC) 10 MG tablet Take 1 tablet by mouth daily. 05/28/18   [provider]  fluticasone (FLONASE) 50 MCG/ACT nasal spray Place 2 sprays into both nostrils daily. 05/28/18   [provider]  ibuprofen (ADVIL) 800 MG tablet Take 1 tablet by mouth 2 (two) times daily as needed. 05/28/18   [provider]  lamoTRIgine (LAMICTAL) 200 MG tablet Take 1 tablet by mouth daily. 06/01/18    [provider]  metFORMIN (GLUCOPHAGE-XR) 500 MG 24 hr tablet Take 1 tablet by mouth 2 (two) times a day. 05/28/18   [provider]  Testosterone 10 MG/ACT (2%) GEL Apply 4 Pump topically daily. 06/11/18   [provider]  TRICOR 48 MG tablet Take 1 tablet by mouth daily. 05/01/18   [provider]    Family History No family history on file.  Social History Social History   Tobacco Use  . Smoking status: Not on file  Substance Use Topics  . Alcohol use: Not on file  . Drug use: Not on file     Allergies   Patient has no known allergies.   Review of Systems Review of Systems  Constitutional: Negative for fever.  Respiratory: Negative for shortness of breath.   Gastrointestinal: Negative for abdominal pain, constipation and vomiting.  Genitourinary: Negative for decreased urine volume, difficulty urinating, dysuria, flank pain and hematuria.  Musculoskeletal: Positive for back pain. Negative for joint swelling.  Skin: Negative for rash.  Neurological: Negative for weakness and numbness.     Physical Exam Updated Vital Signs BP 136/88 (BP Location: Left Arm)   Pulse 76   Temp 98.2 F (36.8 C) (Oral)   Resp 18   Ht 5\' 6"  (1.676 m)   Wt 122.5 kg   SpO2 98%   BMI 43.58 kg/m   Physical Exam Vitals signs and nursing note  reviewed.  Constitutional:      General: He is not in acute distress.    Appearance: He is well-developed.  HENT:     Head: Normocephalic and atraumatic.  Neck:     Musculoskeletal: Normal range of motion and neck supple.  Cardiovascular:     Rate and Rhythm: Normal rate and regular rhythm.     Comments: DP pulses are strong and palpable bilaterally Pulmonary:     Effort: Pulmonary effort is normal. No respiratory distress.     Breath sounds: Normal breath sounds.  Abdominal:     General: There is no distension.     Palpations: Abdomen is soft.     Tenderness: There is no abdominal tenderness.   Musculoskeletal:        General: Tenderness present.     Lumbar back: He exhibits tenderness and pain. He exhibits normal range of motion, no swelling, no deformity, no laceration and normal pulse.     Comments: ttp of the midline lower lumbar spine and bilateral paraspinal muscles.  Positive SLR bilaterally at 20 degrees.  Pt has 5/5 strength against resistance of bilateral lower extremities.     Skin:    General: Skin is warm and dry.     Capillary Refill: Capillary refill takes less than 2 seconds.     Findings: No rash.  Neurological:     Mental Status: He is alert and oriented to person, place, and time.     Sensory: No sensory deficit.     Motor: No abnormal muscle tone.     Coordination: Coordination normal.     Gait: Gait normal.     Deep Tendon Reflexes:     Reflex Scores:      Patellar reflexes are 2+ on the right side and 2+ on the left side.      Achilles reflexes are 2+ on the right side and 2+ on the left side.     ED Treatments / Results  Labs (all labs ordered are listed, but only abnormal results are displayed) Labs Reviewed - No data to display  EKG    Radiology No results found.  Procedures Procedures (including critical care time)  Medications Ordered in ED Medications  ketorolac (TORADOL) injection 60 mg (has no administration in time range)  cyclobenzaprine (FLEXERIL) tablet 10 mg (has no administration in time range)     Initial Impression / Assessment and Plan / ED Course  I have reviewed the triage vital signs and the nursing notes.  Pertinent labs & imaging results that were available during my care of the patient were reviewed by me and considered in my medical decision making (see chart for details).        Pt with recurrent low back pain after a lifting injury earlier today.  No focal neuro deficits.  No hx of trauma.  No urinary or fecal incontinence or retention, no concerning sx'sor hx to suggest spinal abscess.  Sx's are felt to  be musculoskeletal.  I have advised imaging of his lumbar spine, but pt declined stating this happens frequently and he prefers to f/u with his PCP this week when he returns home to [place].  Database reviewed.    1510  On recheck, pt reports feeling better and requesting d/c home.  He ambulated in the dept, gait is slow but steady.  He agrees to close f/u with his PCP when he returns home.  Return precautions discussed   Final Clinical Impressions(s) / ED Diagnoses  Final diagnoses:  Acute midline low back pain with bilateral sciatica    ED Discharge Orders    None       Kem Parkinson, PA-C 06/27/18 [place], [place], DO 06/29/18 [phone]

## 2018-09-17 ENCOUNTER — Encounter: Primary: Internal Medicine

## 2018-09-24 ENCOUNTER — Encounter: Primary: Internal Medicine

## 2018-10-01 ENCOUNTER — Encounter: Attending: "Endocrinology | Primary: Internal Medicine

## 2018-10-01 ENCOUNTER — Telehealth: Admit: 2018-10-01 | Payer: TRICARE (CHAMPUS) | Attending: "Endocrinology | Primary: Internal Medicine

## 2018-10-01 ENCOUNTER — Telehealth: Attending: "Endocrinology | Primary: Internal Medicine

## 2018-10-01 DIAGNOSIS — E291 Testicular hypofunction: Secondary | ICD-10-CM

## 2018-10-01 MED ORDER — QSYMIA 7.5 MG-46 MG CAPSULE, EXTENDED RELEASE
ORAL_CAPSULE | ORAL | 3 refills | Status: DC
Start: 2018-10-01 — End: 2018-10-08

## 2018-10-01 MED ORDER — QSYMIA 7.5 MG-46 MG CAPSULE, EXTENDED RELEASE
ORAL_CAPSULE | ORAL | 1 refills | Status: DC
Start: 2018-10-01 — End: 2018-10-01

## 2018-10-01 NOTE — Progress Notes (Signed)
**THIS IS A VIRTUAL VISIT VIA AUDIO- VIDEO SYNCHRONOUS DISCUSSION. PATIENT AGREED TO HAVE THEIR CARE DELIVERED OVER A MYCHART/DOXY.ME VIDEO VISIT IN PLACE OF THEIR REGULARLY SCHEDULED OFFICE VISIT**   Pt  is aware that this is a billable encounter and is responsible for copays/deductibles   Patient gave a verbal consent to proceed with virtual video visit   Patient is at home and I, the provider,  am at the office care diabetes and endocrinology    HISTORY OF PRESENT ILLNESS  Richard Olsen is a 43 y.o. male.  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     He is requesting weight loss medication   He still feels he did good progress with  Keeping his weight down   He is out of walking boot - did nto require surgery     269.8 lb - on home scale         Old history  :     Cannot comment on weight much   Roughly  4 lbs gain By his calc-    He is seeing new psych doc  He stopped belviq xr   From FDA recall   He was in "walking boot " for achilles tendon repair -  Second time left foot   He is trying to work around the house       Old history     Lost 26 lbs   Gained 5 lbs ( he says he lost 50 lbs IN FACT BUT MY RECORDS SHOW HE LOST 25 LBS  )   He stopped eating sugars and carbs   Tolerating belviq xr  Intermittently           Review of Systems   Constitutional: fatigue    HENT: Negative.    Eyes: Negative for pain and redness.   Respiratory: Negative.    Cardiovascular: Negative for chest pain, palpitations and leg swelling.   Gastrointestinal: Negative.  Negative for constipation.   Genitourinary: Negative.    Musculoskeletal: Negative for myalgias.   Skin: Negative.    Neurological: Negative.    Endo/Heme/Allergies: Negative.    Psychiatric/Behavioral: Negative for depression and memory loss. The patient does not have insomnia.        Physical Exam   Constitutional: He is oriented to person, place, and time. He appears well-developed and well-nourished.   HENT:   Head:  Normocephalic.   Eyes: Conjunctivae and EOM are normal. Pupils are equal, round, and reactive to light.   Neck: Normal range of motion. Neck supple. No JVD present. No tracheal deviation present. No thyromegaly present.   Musculoskeletal: Normal range of motion.   Neurological: He is alert and oriented to person, place, and time. He has normal reflexes.   Skin: Skin is warm.   Psychiatric: He has a normal mood and affect.       Lab Results   Component Value Date/Time    Hemoglobin A1c 5.3 03/19/2018 08:21 AM    Hemoglobin A1c 5.4 03/06/2017 08:42 AM    Hemoglobin A1c 5.4 02/22/2016 08:21 AM    Glucose 92 03/19/2018 08:21 AM    LDL, calculated 99 09/11/2017 08:16 AM    Creatinine 0.79 03/19/2018 08:21 AM      Lab Results   Component Value Date/Time    Cholesterol, total 166 09/11/2017 08:16 AM    HDL Cholesterol 38 (L) 09/11/2017 08:16 AM    LDL, calculated 99  09/11/2017 08:16 AM    Triglyceride 144 09/11/2017 08:16 AM     Lab Results   Component Value Date/Time    ALT (SGPT) 58 03/19/2018 08:21 AM    Alk. phosphatase 57 03/19/2018 08:21 AM    Bilirubin, total 0.5 03/19/2018 08:21 AM    Albumin 4.2 03/19/2018 08:21 AM    Protein, total 7.2 03/19/2018 08:21 AM    PLATELET 236 03/19/2018 08:21 AM     Lab Results   Component Value Date/Time    GFR est non-AA >60 03/19/2018 08:21 AM    GFR est AA >60 03/19/2018 08:21 AM    Creatinine 0.79 03/19/2018 08:21 AM    BUN 12 03/19/2018 08:21 AM    Sodium 139 03/19/2018 08:21 AM    Potassium 4.8 03/19/2018 08:21 AM    Chloride 108 03/19/2018 08:21 AM    CO2 25 03/19/2018 08:21 AM     Lab Results   Component Value Date/Time    TSH 1.590 05/04/2015 10:00 AM    T4, Free 1.05 10/10/2013 10:07 AM            ASSESSMENT and PLAN    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 Hypogonadism :   FSH and LH are in mid range   He is off     fortesta since feb 2016 and resumed it in aug 2016   Within a month of starting it , gained 17 lbs and again another 13 lbs of weight gain in oct 2016   reduced  the dose to 4 depressions a day as he took 6 depressions a day on his own despite written instructions   Asking him to not use it intermittently   Explained the rationale behind his weight gain and rages   Asked him to  cut down  TT 2 % gel FORTESTA   to 2 depressions a day   TT is an optional medication, so there is no compulsion to be on it   Sept 2019  : I advised him to increase  To 4 depressions a day in sept 2019     March 2020  : decrease to 3 depressions a day     Sept 2020 :  Stay  On TT  3 depressions a day       2. Obesity  : There is no height or weight on file to calculate BMI.  Ruled out central cushings and growth hormone deficiency by labs   salivary cortisol levels - 2 collections - normal   Stay on TT supplementation  Will try Q -symia - prior auth       3.  dys-metabolic syndrome : prediabetes and dyslipidemia   Discussed TLC  a1c is 5.4%  Is very good   He got metformin ER 500 mg 4 pills  bid ( ABLE TO TOLERATE 3 PILLS )  BY  ACC/AHA calculator, he does not require the statin  on fibrate -tricor 48 mg - TG are good       4.  Social Anxiety disorder , ADHD and PTSD   Back onto  Adderrall, Started on Lamotrigine and he feels good   Dr. Caprice Red - [facility]     5.  Sleep apnoea - on cpap as best as he can he says     6. Quit smoking  - dec 2016     > 50 % of time is spent on counseling   Patient voiced understanding her plan of care     F/u in 6 months and labs bnv         Pursuant  To the Emergency declaration under the [employer] and the [employer], [place] waiver authority and the [employer], to reduce the patient's risk of exposure to  COVID-19 and provide continuity of care for an established patient.

## 2018-10-01 NOTE — Progress Notes (Signed)
1. Have you been to the ER, urgent care clinic since your last visit?No  Hospitalized since your last visit?No    2. Have you seen or consulted any other health care providers outside of the [facility] since your last visit?  Include any pap smears or colon screening. No

## 2018-10-01 NOTE — Patient Instructions (Addendum)
Start on q-symia  7.5 -46  ( [facility]) - sent it        metformin ER to 500 mg 2  Pills  Twice a day with food     Stay on Tricor 48 mg a day at night     Testosterone  gel    TO   3   depressions a day (DNCP )    ----------------------------------------------------

## 2018-10-01 NOTE — Progress Notes (Signed)
**THIS IS A VIRTUAL VISIT VIA AUDIO- VIDEO SYNCHRONOUS DISCUSSION. PATIENT AGREED TO HAVE THEIR CARE DELIVERED OVER A MYCHART/DOXY.ME VIDEO VISIT IN PLACE OF THEIR REGULARLY SCHEDULED OFFICE VISIT**   Pt  is aware that this is a billable encounter and is responsible for copays/deductibles   Patient gave a verbal consent to proceed with virtual video visit   Patient is at home and I, the provider,  am at the office care diabetes and endocrinology    HISTORY OF PRESENT ILLNESS  Richard Olsen is a 43 y.o. male.  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     He is requesting weight loss medication   He still feels he did good progress with  Keeping his weight down   He is out of walking boot - did nto require surgery     269.8 lb - on home scale         Old history  :     Cannot comment on weight much   Roughly  4 lbs gain By his calc-    He is seeing new psych doc  He stopped belviq xr   From FDA recall   He was in "walking boot " for achilles tendon repair -  Second time left foot   He is trying to work around the house       Old history     Lost 26 lbs   Gained 5 lbs ( he says he lost 50 lbs IN FACT BUT MY RECORDS SHOW HE LOST 25 LBS  )   He stopped eating sugars and carbs   Tolerating belviq xr  Intermittently           Review of Systems   Constitutional: fatigue    HENT: Negative.    Eyes: Negative for pain and redness.   Respiratory: Negative.    Cardiovascular: Negative for chest pain, palpitations and leg swelling.   Gastrointestinal: Negative.  Negative for constipation.   Genitourinary: Negative.    Musculoskeletal: Negative for myalgias.   Skin: Negative.    Neurological: Negative.    Endo/Heme/Allergies: Negative.    Psychiatric/Behavioral: Negative for depression and memory loss. The patient does not have insomnia.        Physical Exam   Constitutional: He is oriented to person, place, and time. He appears well-developed and well-nourished.   HENT:    Head: Normocephalic.   Eyes: Conjunctivae and EOM are normal. Pupils are equal, round, and reactive to light.   Neck: Normal range of motion. Neck supple. No JVD present. No tracheal deviation present. No thyromegaly present.   Musculoskeletal: Normal range of motion.   Neurological: He is alert and oriented to person, place, and time. He has normal reflexes.   Skin: Skin is warm.   Psychiatric: He has a normal mood and affect.       Lab Results   Component Value Date/Time    Hemoglobin A1c 5.3 03/19/2018 08:21 AM    Hemoglobin A1c 5.4 03/06/2017 08:42 AM    Hemoglobin A1c 5.4 02/22/2016 08:21 AM    Glucose 92 03/19/2018 08:21 AM    LDL, calculated 99 09/11/2017 08:16 AM    Creatinine 0.79 03/19/2018 08:21 AM      Lab Results   Component Value Date/Time    Cholesterol, total 166 09/11/2017 08:16 AM    HDL Cholesterol 38 (L) 09/11/2017 08:16 AM    LDL, calculated 99  09/11/2017 08:16 AM    Triglyceride 144 09/11/2017 08:16 AM     Lab Results   Component Value Date/Time    ALT (SGPT) 58 03/19/2018 08:21 AM    Alk. phosphatase 57 03/19/2018 08:21 AM    Bilirubin, total 0.5 03/19/2018 08:21 AM    Albumin 4.2 03/19/2018 08:21 AM    Protein, total 7.2 03/19/2018 08:21 AM    PLATELET 236 03/19/2018 08:21 AM     Lab Results   Component Value Date/Time    GFR est non-AA >60 03/19/2018 08:21 AM    GFR est AA >60 03/19/2018 08:21 AM    Creatinine 0.79 03/19/2018 08:21 AM    BUN 12 03/19/2018 08:21 AM    Sodium 139 03/19/2018 08:21 AM    Potassium 4.8 03/19/2018 08:21 AM    Chloride 108 03/19/2018 08:21 AM    CO2 25 03/19/2018 08:21 AM     Lab Results   Component Value Date/Time    TSH 1.590 05/04/2015 10:00 AM    T4, Free 1.05 10/10/2013 10:07 AM            ASSESSMENT and PLAN    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 Hypogonadism :   FSH and LH are in mid range    He is off    fortesta since feb 2016 and resumed it in aug 2016   Within a month of starting it , gained 17 lbs and again another 13 lbs of weight gain in oct 2016   reduced  the dose to 4 depressions a day as he took 6 depressions a day on his own despite written instructions   Asking him to not use it intermittently   Explained the rationale behind his weight gain and rages   Asked him to  cut down  TT 2 % gel FORTESTA   to 2 depressions a day   TT is an optional medication, so there is no compulsion to be on it   Sept 2019  : I advised him to increase  To 4 depressions a day in sept 2019     March 2020  : decrease to 3 depressions a day     Sept 2020 :  Stay  On TT  3 depressions a day       2. Obesity  : There is no height or weight on file to calculate BMI.  Ruled out central cushings and growth hormone deficiency by labs   salivary cortisol levels - 2 collections - normal   Stay on TT supplementation  Will try Q -symia - prior auth       3.  dys-metabolic syndrome : prediabetes and dyslipidemia   Discussed TLC  a1c is 5.4%  Is very good   He got metformin ER 500 mg 4 pills  bid ( ABLE TO TOLERATE 3 PILLS )  BY  ACC/AHA calculator, he does not require the statin  on fibrate -tricor 48 mg - TG are good       4.  Social Anxiety disorder , ADHD and PTSD   Back onto  Adderrall, Started on Lamotrigine and he feels good   Dr. Caprice Red - [facility]     5.  Sleep apnoea - on cpap as best as he can he says     6. Quit smoking  - dec 2016     > 50 % of time is spent on counseling   Patient voiced understanding her plan of care     F/u in 6 months and labs bnv         Pursuant  To the Emergency declaration under the [employer] and the [employer], [place] waiver authority and the [employer], to reduce the patient's risk of exposure to  COVID-19 and provide continuity of care for an established patient.

## 2018-10-01 NOTE — Progress Notes (Signed)
1. Have you been to the ER, urgent care clinic since your last visit?No  Hospitalized since your last visit?No    2. Have you seen or consulted any other health care providers outside of the [facility] since your last visit?  Include any pap smears or colon screening. No

## 2018-10-08 ENCOUNTER — Telehealth

## 2018-10-08 MED ORDER — QSYMIA 7.5 MG-46 MG CAPSULE, EXTENDED RELEASE
ORAL_CAPSULE | ORAL | 3 refills | Status: DC
Start: 2018-10-08 — End: 2018-11-18

## 2018-10-08 NOTE — Telephone Encounter (Signed)
Patient needs Qsymia to [facility]

## 2018-10-13 NOTE — Telephone Encounter (Signed)
Spoke with pt, notified that Dr has requested a Peer to Peer with insurance company for approval.  Will notify pt when decision is received

## 2018-10-13 NOTE — Telephone Encounter (Signed)
-----   Message from Claudina Lick sent at 10/13/2018 11:27 AM EDT -----  Regarding: Dr. Lanney Gins  Pt would like a call back regarding getting a pre authorization for his meds. Contact is [phone]

## 2018-10-14 NOTE — Telephone Encounter (Signed)
Spoke with pt this AM, notified DOD does not allow Peer to Peer review, will need to fax in appeal request.  Pt notified he needed to give written consent prior to appeal being sent.

## 2018-10-22 NOTE — Telephone Encounter (Signed)
Spoke with pt, gave verbiage for letter needed

## 2018-10-22 NOTE — Telephone Encounter (Signed)
-----   Message from Nicole Cella sent at 10/22/2018 12:18 PM EDT -----  Regarding: Dr. Vennie Homans  General Message/Vendor Calls    Caller's first and last name:Argus Lester Kinsman      Reason for call:nurse or doctor call back      Callback required yes/no and why:yes      Best contact number(s):[phone]      Details to clarify the request:Pt would like a call to give the verbiage to [place] regarding his medication and giving the doctor his permission to talk on his behalf.      Nicole Cella

## 2018-11-08 ENCOUNTER — Encounter

## 2018-11-10 MED ORDER — TESTOSTERONE 10 MG/0.5 GRAM/ACTUATION TRANSDERMAL GEL PUMP
10 mg/0.5 gram /actuation | TRANSDERMAL | 4 refills | Status: AC
Start: 2018-11-10 — End: ?

## 2018-11-18 ENCOUNTER — Encounter

## 2018-11-19 ENCOUNTER — Telehealth

## 2018-11-19 MED ORDER — QSYMIA 7.5 MG-46 MG CAPSULE, EXTENDED RELEASE
ORAL_CAPSULE | ORAL | 3 refills | Status: AC
Start: 2018-11-19 — End: ?

## 2018-11-19 NOTE — Telephone Encounter (Signed)
Patient needs Qsymia sent to local pharmacy. 90 day supply.  Mail order denied stating to send to local pharmacy.

## 2018-12-09 NOTE — Telephone Encounter (Signed)
Pt prefers refill to be sent to local pharmacy on file.     PCP: Richard Czech, MD    Last appt: 10/01/2018  No future appointments.    Requested Prescriptions     Pending Prescriptions Disp Refills   ??? phentermine-topiramate (Qsymia) 7.5-46 mg CM24 90 Cap 3     Sig: One pill a day     No visits with results within 3 Month(s) from this visit.   Latest known visit with results is:   Hospital Outpatient Visit on 03/19/2018   Component Date Value Ref Range Status   ??? WBC 03/19/2018 5.9  4.1 - 11.1 K/uL Final   ??? RBC 03/19/2018 5.32  4.10 - 5.70 M/uL Final   ??? HGB 03/19/2018 15.3  12.1 - 17.0 g/dL Final   ??? HCT 03/19/2018 47.2  36.6 - 50.3 % Final   ??? MCV 03/19/2018 88.7  80.0 - 99.0 FL Final   ??? MCH 03/19/2018 28.8  26.0 - 34.0 PG Final   ??? MCHC 03/19/2018 32.4  30.0 - 36.5 g/dL Final   ??? RDW 03/19/2018 13.2  11.5 - 14.5 % Final   ??? PLATELET 03/19/2018 236  150 - 400 K/uL Final   ??? MPV 03/19/2018 9.7  8.9 - 12.9 FL Final   ??? NRBC 03/19/2018 0.0  0 PER 100 WBC Final   ??? ABSOLUTE NRBC 03/19/2018 0.00  0.00 - 0.01 K/uL Final   ??? NEUTROPHILS 03/19/2018 47  32 - 75 % Final   ??? LYMPHOCYTES 03/19/2018 38  12 - 49 % Final   ??? MONOCYTES 03/19/2018 9  5 - 13 % Final   ??? EOSINOPHILS 03/19/2018 4  0 - 7 % Final   ??? BASOPHILS 03/19/2018 2* 0 - 1 % Final   ??? IMMATURE GRANULOCYTES 03/19/2018 0  0.0 - 0.5 % Final   ??? ABS. NEUTROPHILS 03/19/2018 2.8  1.8 - 8.0 K/UL Final   ??? ABS. LYMPHOCYTES 03/19/2018 2.3  0.8 - 3.5 K/UL Final   ??? ABS. MONOCYTES 03/19/2018 0.5  0.0 - 1.0 K/UL Final   ??? ABS. EOSINOPHILS 03/19/2018 0.2  0.0 - 0.4 K/UL Final   ??? ABS. BASOPHILS 03/19/2018 0.1  0.0 - 0.1 K/UL Final   ??? ABS. IMM. GRANS. 03/19/2018 0.0  0.00 - 0.04 K/UL Final   ??? DF 03/19/2018 AUTOMATED    Final   ??? Testosterone 03/19/2018 865  264 - 916 ng/dL Final    Comment: (NOTE)  Adult male reference interval is based on a population of  healthy nonobese males (BMI <30) between 94 and 19 years old.   [place], [place] [phone]. PMID: [id number].  Performed At: [facility]  [place], [place] [id number]  Rush Farmer MD [id number]     ??? Free testosterone (Direct) 03/19/2018 25.1* 6.8 - 21.5 pg/mL Final    Comment: (NOTE)  Performed At: [facility]  [place], [place] [id number]  Rush Farmer MD [id number]     ??? [facility] 03/19/2018 7.4  mIU/mL Final    Comment: (NOTE)  FEMALES:   0-12 YR:  No reference range established   Pregnant: No reference range established  Non Pregnant:   Follicular Phase: 4.2-70.6  Mid-cycle peak: 5.2-17.5  Luteal Phase: 1.7-9.5  Post-menopausal on Menopausal Hormone Therapy (MHT): 5.9-72.8  Post-menopausal not on MHT: 12.7-132.2    MALES:  0-12 YR:   No reference range established  >12 YR:   0.7-10.8       ???  Luteinizing hormone 03/19/2018 2.3  mIU/mL Final    Comment: (NOTE)  FEMALES:  0-12 YR: No reference range established  Pregnant: No reference range established  Non Pregnant:  Follicular Phase: 3.2-44.0  Mid-cycle peak: 22.8-76.1  Luteal Phase: 0.6-13.5  Post-menopausal on Menopausal Hormone Therapy (MHT): 1.1-52.4  Post-menopausal Not on MHT: 8.6-61.8    MALES:  0-12 YR: No reference range established  >12 YR:  1.2-10.6     ??? Hemoglobin A1c 03/19/2018 5.3  4.0 - 5.6 % Final    Comment: NEW METHOD  PLEASE NOTE NEW REFERENCE RANGE  (NOTE)  HbA1C Interpretive Ranges  <5.7              Normal  5.7 - 6.4         Consider Prediabetes  >6.5              Consider Diabetes     ??? Est. average glucose 03/19/2018 105  mg/dL Final   ??? Sodium 03/19/2018 139  136 - 145 mmol/L Final   ??? Potassium 03/19/2018 4.8  3.5 - 5.1 mmol/L Final   ??? Chloride 03/19/2018 108  97 - 108 mmol/L Final   ??? CO2 03/19/2018 25  21 - 32 mmol/L Final   ??? Anion gap 03/19/2018 6  5 - 15 mmol/L Final   ??? Glucose 03/19/2018 92  65 - 100 mg/dL Final   ??? BUN 03/19/2018 12  6 - 20 MG/DL Final   ??? Creatinine 03/19/2018 0.79  0.70 - 1.30 MG/DL Final    ??? BUN/Creatinine ratio 03/19/2018 15  12 - 20   Final   ??? GFR est AA 03/19/2018 >60  >60 ml/min/1.66m Final   ??? GFR est non-AA 03/19/2018 >60  >60 ml/min/1.72mFinal    Comment: Estimated GFR is calculated using the IDMS-traceable Modification of Diet in Renal Disease (MDRD) Study equation, reported for both African Americans (GFRAA) and non-African Americans (GFRNA), and normalized to 1.7321mody surface area. The physician must decide which value applies to the patient.  The MDRD study equation should only be used in individuals age 31 34 older. It has not been validated for the following: pregnant women, patients with serious comorbid conditions, or on certain medications, or persons with extremes of body size, muscle mass, or nutritional status.     ??? Calcium 03/19/2018 8.8  8.5 - 10.1 MG/DL Final   ??? Bilirubin, total 03/19/2018 0.5  0.2 - 1.0 MG/DL Final   ??? ALT (SGPT) 03/19/2018 58  12 - 78 U/L Final   ??? AST (SGOT) 03/19/2018 23  15 - 37 U/L Final   ??? Alk. phosphatase 03/19/2018 57  45 - 117 U/L Final   ??? Protein, total 03/19/2018 7.2  6.4 - 8.2 g/dL Final   ??? Albumin 03/19/2018 4.2  3.5 - 5.0 g/dL Final   ??? Globulin 03/19/2018 3.0  2.0 - 4.0 g/dL Final   ??? A-G Ratio 03/19/2018 1.4  1.1 - 2.2   Final   ??? Prostate Specific Ag 03/19/2018 0.6  0.01 - 4.0 ng/mL Final    Comment: Method used is [employer])  Many types of test methods are used to measure PSA and can yield   different results on any given specimen. Therefore PSA results from   different laboratories on the same patient are not directly   comparable. In addition, PSA values by themselves should not be   interpreted as the presence or absence of cancer.  PSA values used  to   monitor for biochemical recurrence of prostate cancer should be   interpreted in accordance with current clinical guidelines (e.g. the   2013 American Urological Association (AUA) guidelines and the 2015   European Association of Urology (EAU) guidelines).

## 2018-12-09 NOTE — Telephone Encounter (Signed)
[facility] on file he states he called previously for this request and still not there please send and call patient per his request so he knows when to expect it to be available

## 2018-12-15 ENCOUNTER — Encounter

## 2019-01-02 ENCOUNTER — Ambulatory Visit
Admission: EM | Admit: 2019-01-02 | Discharge: 2019-01-02 | Disposition: A | Discharge status: Home / Self Care | Attending: Emergency Medicine | Admitting: Emergency Medicine

## 2019-01-02 ENCOUNTER — Other Ambulatory Visit: Payer: Self-pay

## 2019-01-02 DIAGNOSIS — J029 Acute pharyngitis, unspecified: Secondary | ICD-10-CM | POA: Diagnosis not present

## 2019-01-02 DIAGNOSIS — J324 Chronic pansinusitis: Secondary | ICD-10-CM

## 2019-01-02 DIAGNOSIS — R05 Cough: Secondary | ICD-10-CM | POA: Diagnosis not present

## 2019-01-02 DIAGNOSIS — Z20822 Contact with and (suspected) exposure to covid-19: Secondary | ICD-10-CM

## 2019-01-02 DIAGNOSIS — Z20828 Contact with and (suspected) exposure to other viral communicable diseases: Secondary | ICD-10-CM

## 2019-01-02 MED ORDER — ALBUTEROL SULFATE HFA 108 (90 BASE) MCG/ACT IN AERS: 1.0000 | INHALATION_SPRAY | Freq: Four times a day (QID) | RESPIRATORY_TRACT | 0 refills | Status: DC | PRN

## 2019-01-02 MED ORDER — BENZONATATE 100 MG PO CAPS: 100.0000 mg | ORAL_CAPSULE | Freq: Three times a day (TID) | ORAL | 0 refills | Status: DC

## 2019-01-02 MED ORDER — AMOXICILLIN-POT CLAVULANATE 875-125 MG PO TABS: 1.0000 | ORAL_TABLET | Freq: Two times a day (BID) | ORAL | 0 refills | Status: DC

## 2019-01-02 NOTE — Discharge Instructions (Signed)
COVID testing ordered.  It will take between 5-7 days for test results.  Someone will contact you regarding abnormal results.    In the meantime: You should remain isolated in your home for 10 days from symptom onset AND greater than 72 hours after symptoms resolution (absence of fever without the use of fever-reducing medication and improvement in respiratory symptoms), whichever is longer Get plenty of rest and push fluids Augmentin prescribed for possible sinus infection.  Take as directed and to completion Tessalon Perles prescribed for cough Use OTC zyrtec for nasal congestion, runny nose, and/or sore throat Use OTC flonase for nasal congestion and runny nose Use medications daily for symptom relief Use OTC medications like ibuprofen or tylenol as needed fever or pain Call or go to the ED if you have any new or worsening symptoms such as fever, worsening cough, shortness of breath, chest tightness, chest pain, turning blue, changes in mental status, etc..Marland Kitchen

## 2019-01-02 NOTE — ED Provider Notes (Signed)
[place]   [id number] 01/02/19 Arrival Time: 90   CC: COVID symptoms  SUBJECTIVE: History from: patient.  Aaron Hunt is a 43 y.o. male who presents with PND, cough, and sore throat x 3 weeks.  Denies sick exposure to COVID, flu or strep.  Denies recent travel.  Has tried OTC medications without relief.  Symptoms are made worse with laying down.  Reports previous symptoms in the past that resolved without medical intervention.   Denies fever, chills, fatigue,  rhinorrhea, SOB, wheezing, chest pain, nausea, changes in bowel or bladder habits.    ROS: As per HPI.  All other pertinent ROS negative.     Past Medical History:  Diagnosis Date  . Back pain   . MRSA (methicillin resistant staph aureus) culture positive   . Pituitary mass [facility])    Past Surgical History:  Procedure Laterality Date  . BACK SURGERY    . EYE SURGERY Bilateral   . finger surgery Right    littl;e finger x2  . FOOT SURGERY Right   . KNEE SURGERY Left    No Known Allergies No current facility-administered medications on file prior to encounter.   Current Outpatient Medications on File Prior to Encounter  Medication Sig Dispense Refill  . amphetamine-dextroamphetamine (ADDERALL) 20 MG tablet Take 1 tablet by mouth 2 (two) times a day.    . cetirizine (ZYRTEC) 10 MG tablet Take 1 tablet by mouth daily.    . cyclobenzaprine (FLEXERIL) 10 MG tablet Take 1 tablet (10 mg total) by mouth 3 (three) times daily as needed. 21 tablet 0  . fluticasone (FLONASE) 50 MCG/ACT nasal spray Place 2 sprays into both nostrils daily as needed.     Marland Kitchen ibuprofen (ADVIL) 800 MG tablet Take 1 tablet by mouth 2 (two) times daily as needed.    . lamoTRIgine (LAMICTAL) 200 MG tablet Take 1 tablet by mouth daily.    . metFORMIN (GLUCOPHAGE-XR) 500 MG 24 hr tablet Take 1 tablet by mouth 2 (two) times a day.    . Testosterone 10 MG/ACT (2%) GEL Apply 3 Pump topically daily.     . TRICOR 48 MG tablet Take 1 tablet by  mouth daily.     Social History   Socioeconomic History  . Marital status: Married    Spouse name: Not on file  . Number of children: Not on file  . Years of education: Not on file  . Highest education level: Not on file  Occupational History  . Not on file  Tobacco Use  . Smoking status: Former Smoker    Packs/day: 1.00    Years: 26.00    Pack years: 26.00    Types: Cigarettes    Quit date: 12/13/2014    Years since quitting: 4.0  . Smokeless tobacco: Former Network engineer and Sexual Activity  . Alcohol use: Not Currently    Comment: occasioanal  . Drug use: Never  . Sexual activity: Not on file  Other Topics Concern  . Not on file  Social History Narrative  . Not on file   Social Determinants of Health   Financial Resource Strain:   . Difficulty of Paying Living Expenses: Not on file  Food Insecurity:   . Worried About Charity fundraiser in the Last Year: Not on file  . Ran Out of Food in the Last Year: Not on file  Transportation Needs:   . Lack of Transportation (Medical): Not on file  . Lack of Transportation (Non-Medical):  Not on file  Physical Activity:   . Days of Exercise per Week: Not on file  . Minutes of Exercise per Session: Not on file  Stress:   . Feeling of Stress : Not on file  Social Connections:   . Frequency of Communication with Friends and Family: Not on file  . Frequency of Social Gatherings with Friends and Family: Not on file  . Attends Religious Services: Not on file  . Active Member of Clubs or Organizations: Not on file  . Attends Archivist Meetings: Not on file  . Marital Status: Not on file  Intimate Partner Violence:   . Fear of Current or Ex-Partner: Not on file  . Emotionally Abused: Not on file  . Physically Abused: Not on file  . Sexually Abused: Not on file   Family History  Problem Relation Age of Onset  . Cancer Mother   . Cancer Father     OBJECTIVE:  Vitals:   01/02/19 1212  BP: 128/80  Pulse: 82   Resp: 20  Temp: 98.4 F (36.9 C)  SpO2: 97%     General appearance: alert; appears mildly fatigued, but nontoxic; speaking in full sentences and tolerating own secretions HEENT: NCAT; Ears: EACs clear, TMs pearly gray; Eyes: PERRL.  EOM grossly intact. Sinuses: mild tenderness; Nose: nares patent without rhinorrhea, Throat: oropharynx erythematous, tonsils non erythematous or enlarged, uvula midline  Neck: supple without LAD Lungs: unlabored respirations, symmetrical air entry; cough: mild; no respiratory distress; CTAB Heart: regular rate and rhythm.   Skin: warm and dry Psychological: alert and cooperative; normal mood and affect   ASSESSMENT & PLAN:  1. Suspected COVID-19 virus infection   2. Chronic pansinusitis     Meds ordered this encounter  Medications  . amoxicillin-clavulanate (AUGMENTIN) 875-125 MG tablet    Sig: Take 1 tablet by mouth every 12 (twelve) hours.    Dispense:  14 tablet    Refill:  0    Order Specific Question:   Supervising Provider    Answer:   Raylene Everts [id number]  . benzonatate (TESSALON) 100 MG capsule    Sig: Take 1 capsule (100 mg total) by mouth every 8 (eight) hours.    Dispense:  21 capsule    Refill:  0    Order Specific Question:   Supervising Provider    Answer:   Raylene Everts [id number]  . albuterol (VENTOLIN HFA) 108 (90 Base) MCG/ACT inhaler    Sig: Inhale 1-2 puffs into the lungs every 6 (six) hours as needed for wheezing or shortness of breath.    Dispense:  18 g    Refill:  0    Order Specific Question:   Supervising Provider    Answer:   Raylene Everts [id number]   COVID testing ordered.  It will take between 5-7 days for test results.  Someone will contact you regarding abnormal results.    In the meantime: You should remain isolated in your home for 10 days from symptom onset AND greater than 72 hours after symptoms resolution (absence of fever without the use of fever-reducing medication and improvement in  respiratory symptoms), whichever is longer Get plenty of rest and push fluids Augmentin prescribed for possible sinus infection.  Take as directed and to completion Tessalon Perles prescribed for cough Use OTC zyrtec for nasal congestion, runny nose, and/or sore throat Use OTC flonase for nasal congestion and runny nose Use medications daily for symptom relief Use OTC medications like  ibuprofen or tylenol as needed fever or pain Call or go to the ED if you have any new or worsening symptoms such as fever, worsening cough, shortness of breath, chest tightness, chest pain, turning blue, changes in mental status, etc...   Reviewed expectations re: course of current medical issues. Questions answered. Outlined signs and symptoms indicating need for more acute intervention. Patient verbalized understanding. After Visit Summary given.         Lestine Box, PA-C 01/02/19 1306

## 2019-01-02 NOTE — ED Triage Notes (Signed)
Pt presents with c/o cough and sore throat, pt reports sinus symptoms x 3 weeks

## 2019-01-03 LAB — NOVEL CORONAVIRUS, NAA: SARS-CoV-2, NAA: NOT DETECTED

## 2019-01-15 ENCOUNTER — Telehealth

## 2019-01-15 ENCOUNTER — Other Ambulatory Visit: Payer: Self-pay

## 2019-01-15 ENCOUNTER — Ambulatory Visit
Admission: EM | Admit: 2019-01-15 | Discharge: 2019-01-15 | Disposition: A | Discharge status: Home / Self Care | Attending: Emergency Medicine | Admitting: Emergency Medicine

## 2019-01-15 ENCOUNTER — Ambulatory Visit (INDEPENDENT_AMBULATORY_CARE_PROVIDER_SITE_OTHER)

## 2019-01-15 DIAGNOSIS — Z20822 Contact with and (suspected) exposure to covid-19: Secondary | ICD-10-CM

## 2019-01-15 DIAGNOSIS — J189 Pneumonia, unspecified organism: Secondary | ICD-10-CM | POA: Diagnosis not present

## 2019-01-15 DIAGNOSIS — R05 Cough: Secondary | ICD-10-CM

## 2019-01-15 DIAGNOSIS — R053 Chronic cough: Secondary | ICD-10-CM

## 2019-01-15 MED ORDER — CEFTRIAXONE SODIUM 1 G IJ SOLR
1.0000 g | Freq: Once | INTRAMUSCULAR | Status: AC
  Administered 2019-01-15: 11:00:00 1 g via INTRAMUSCULAR

## 2019-01-15 MED ORDER — ALBUTEROL SULFATE HFA 108 (90 BASE) MCG/ACT IN AERS: 1.0000 | INHALATION_SPRAY | Freq: Four times a day (QID) | RESPIRATORY_TRACT | 0 refills | Status: DC | PRN

## 2019-01-15 MED ORDER — HYDROCODONE-HOMATROPINE 5-1.5 MG/5ML PO SYRP: 5.0000 mL | ORAL_SOLUTION | Freq: Two times a day (BID) | ORAL | 0 refills | Status: DC | PRN

## 2019-01-15 MED ORDER — BENZONATATE 100 MG PO CAPS: 100.0000 mg | ORAL_CAPSULE | Freq: Three times a day (TID) | ORAL | 0 refills | Status: DC

## 2019-01-15 MED ORDER — LEVOFLOXACIN 750 MG PO TABS: 750.0000 mg | ORAL_TABLET | Freq: Every day | ORAL | 0 refills | Status: DC

## 2019-01-15 MED ORDER — LEVOFLOXACIN 750 MG PO TABS: 750.0000 mg | ORAL_TABLET | Freq: Every day | ORAL | 0 refills | Status: AC

## 2019-01-15 NOTE — Discharge Instructions (Signed)
X-rays showed a early pneumonia Get plenty of rest and push fluids Tessalon for cough Albuterol inhaler as needed for shortness of breath and/or coughing spasm Hycodan syrup prescribed for nighttime relief of cough.  DO NOT DRIVE or operate heavy machinery while taking this medication as it may make you drowsy Levaquin prescribed for pneumonia.  Take as directed and to completion 1 gram of rocephin given in office Follow up with PCP if symptoms persist Return or go to ER if you have any new or worsening symptoms fever, chills, nausea, vomiting, worsening cough despite medications, chest pain, shortness of breath, etc...  Recommend repeat x-ray in 6 weeks to exclude underlying malignancy

## 2019-01-15 NOTE — ED Triage Notes (Signed)
Pt presents to UC w/ c/o cough, loss of voice, sinus pressure and congestion. Pt states he was seen here 2 weeks ago w/ same symptoms and they have not improved.

## 2019-01-15 NOTE — ED Provider Notes (Signed)
[place]   [id number] 01/15/19 Arrival Time: [id number]  Cc: COUGH  SUBJECTIVE:  Aaron Hunt is a 44 y.o. male who presents with persistent productive cough (that he cannot get up), and loss of voice x >1 month.  Denies positive sick exposure or precipitating event.  Was seen 2 weeks ago and diagnosed with sinus infection.  Treated with augmentin, tessalon, and albuterol inhaler with minimal relief.  Symptoms are made worse with getting up during the night, or going outside in dry air.  Denies previous symptoms in the past.   Denies fever, chills, fatigue, sinus pain, rhinorrhea, sore throat, SOB, wheezing, chest pain, nausea, vomiting, changes in bowel or bladder habits.    ROS: As per HPI.  All other pertinent ROS negative.     Past Medical History:  Diagnosis Date  . Back pain   . MRSA (methicillin resistant staph aureus) culture positive   . 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 BACK SURGERY    . EYE SURGERY Bilateral   . finger surgery Right    littl;e finger x2  . FOOT SURGERY Right   . KNEE SURGERY Left    No Known Allergies No current facility-administered medications on file prior to encounter.   Current Outpatient Medications on File Prior to Encounter  Medication Sig Dispense Refill  . amphetamine-dextroamphetamine (ADDERALL) 20 MG tablet Take 1 tablet by mouth 2 (two) times a day.    . cetirizine (ZYRTEC) 10 MG tablet Take 1 tablet by mouth daily.    . fluticasone (FLONASE) 50 MCG/ACT nasal spray Place 2 sprays into both nostrils daily as needed.     Marland Kitchen ibuprofen (ADVIL) 800 MG tablet Take 1 tablet by mouth 2 (two) times daily as needed.    . lamoTRIgine (LAMICTAL) 200 MG tablet Take 1 tablet by mouth daily.    . metFORMIN (GLUCOPHAGE-XR) 500 MG 24 hr tablet Take 1 tablet by mouth 2 (two) times a day.    . Testosterone 10 MG/ACT (2%) GEL Apply 3 Pump topically daily.     . TRICOR 48 MG tablet Take 1 tablet by mouth  daily.      Social History   Socioeconomic History  . Marital status: Married    Spouse name: Not on file  . Number of children: Not on file  . Years of education: Not on file  . Highest education level: Not on file  Occupational History  . Not on file  Tobacco Use  . Smoking status: Former Smoker    Packs/day: 1.00    Years: 26.00    Pack years: 26.00    Types: Cigarettes    Quit date: 12/13/2014    Years since quitting: 4.0  . Smokeless tobacco: Former Network engineer and Sexual Activity  . Alcohol use: Not Currently    Comment: occasioanal  . Drug use: Never  . Sexual activity: Not on file  Other Topics Concern  . Not on file  Social History Narrative  . Not on file   Social Determinants of Health   Financial Resource Strain:   . Difficulty of Paying Living Expenses: Not on file  Food Insecurity:   . Worried About Charity fundraiser in the Last Year: Not on file  . Ran Out of Food in the Last Year: Not on file  Transportation Needs:   . Lack of Transportation (Medical): Not on file  . Lack of Transportation (Non-Medical): Not on file  Physical Activity:   . Days of Exercise per Week: Not on file  . Minutes of Exercise per Session: Not on file  Stress:   . Feeling of Stress : Not on file  Social Connections:   . Frequency of Communication with Friends and Family: Not on file  . Frequency of Social Gatherings with Friends and Family: Not on file  . Attends Religious Services: Not on file  . Active Member of Clubs or Organizations: Not on file  . Attends Archivist Meetings: Not on file  . Marital Status: Not on file  Intimate Partner Violence:   . Fear of Current or Ex-Partner: Not on file  . Emotionally Abused: Not on file  . Physically Abused: Not on file  . Sexually Abused: Not on file   Family History  Problem Relation Age of Onset  . Cancer Mother   . Cancer Father      OBJECTIVE:  Vitals:   01/15/19 0954 01/15/19 0957  BP: 132/89  123/79  Pulse: 77   Resp: 16   Temp: 97.9 F (36.6 C)   TempSrc: Oral   SpO2: 98%      General appearance: Alert, appears fatigued, but nontoxic; speaking in full sentences without difficulty HEENT:NCAT; Ears: EACs clear, TMs pearly gray; Eyes: PERRL.  EOM grossly intact. Nose: nares patent without rhinorrhea; Throat: tonsils nonerythematous or enlarged, uvula midline  Neck: supple without LAD Lungs: clear to auscultation bilaterally without adventitious breath sounds; normal respiratory effort; moderate persistent cough present Heart: regular rate and rhythm.  Skin: warm and dry Psychological: alert and cooperative; normal mood and affect  DIAGNOSTIC STUDIES:  DG Chest 2 View  Result Date: 01/15/2019 CLINICAL DATA:  Cough and congestion EXAM: CHEST - 2 VIEW COMPARISON:  None. FINDINGS: There is rather subtle ill-defined opacity in the right base. Lungs elsewhere are clear. Heart size and pulmonary vascularity are normal. No adenopathy. There is degenerative change in the thoracic spine. IMPRESSION: Subtle increased opacity right base, suspicious for early pneumonia. Lungs elsewhere clear. Cardiac silhouette normal. No adenopathy. These results will be called to the ordering clinician or representative by the Radiologist Assistant, and communication documented in the PACS or zVision Dashboard. Electronically Signed   By: Lowella Grip III M.D.   On: 01/15/2019 10:09    X-rays positive for RT lower lobe pneumonina  I have reviewed the x-rays myself and the radiologist interpretation. I am in agreement with the radiologist interpretation.     ASSESSMENT & PLAN:  1. Pneumonia of right lower lobe due to infectious organism   2. Persistent cough for 3 weeks or longer     Meds ordered this encounter  Medications  . DISCONTD: levofloxacin (LEVAQUIN) 750 MG tablet    Sig: Take 1 tablet (750 mg total) by mouth daily for 5 days.    Dispense:  5 tablet    Refill:  0    Order Specific  Question:   Supervising Provider    Answer:   Raylene Everts [id number]  . levofloxacin (LEVAQUIN) 750 MG tablet    Sig: Take 1 tablet (750 mg total) by mouth daily for 5 days.    Dispense:  5 tablet    Refill:  0    Order Specific Question:   Supervising Provider    Answer:   Raylene Everts [id number]  . benzonatate (TESSALON) 100 MG capsule    Sig: Take 1 capsule (100 mg total) by mouth every 8 (eight) hours.  Dispense:  21 capsule    Refill:  0    Order Specific Question:   Supervising Provider    Answer:   Raylene Everts [id number]  . albuterol (VENTOLIN HFA) 108 (90 Base) MCG/ACT inhaler    Sig: Inhale 1-2 puffs into the lungs every 6 (six) hours as needed for wheezing or shortness of breath.    Dispense:  18 g    Refill:  0    Order Specific Question:   Supervising Provider    Answer:   Raylene Everts [id number]  . HYDROcodone-homatropine (HYCODAN) 5-1.5 MG/5ML syrup    Sig: Take 5 mLs by mouth every 12 (twelve) hours as needed for cough.    Dispense:  60 mL    Refill:  0    Order Specific Question:   Supervising Provider    Answer:   Raylene Everts [id number]    Orders Placed This Encounter  Procedures  . DG Chest 2 View    Standing Status:   Standing    Number of Occurrences:   1    Order Specific Question:   Reason for Exam (SYMPTOM  OR DIAGNOSIS REQUIRED)    Answer:   persistent cough     X-rays showed a early pneumonia Get plenty of rest and push fluids Tessalon for cough Albuterol inhaler as needed for shortness of breath and/or coughing spasm Hycodan syrup prescribed for nighttime relief of cough.  DO NOT DRIVE or operate heavy machinery while taking this medication as it may make you drowsy Levaquin prescribed for pneumonia.  Take as directed and to completion 1 gram of rocephin given in office Follow up with PCP if symptoms persist Return or go to ER if you have any new or worsening symptoms fever, chills, nausea, vomiting, worsening cough  despite medications, chest pain, shortness of breath, etc...  Recommend repeat x-ray in 6 weeks to exclude underlying malignancy   Reviewed expectations re: course of current medical issues. Questions answered. Outlined signs and symptoms indicating need for more acute intervention. Patient verbalized understanding. After Visit Summary given.          Lestine Box, PA-C 01/15/19 1042

## 2019-01-27 ENCOUNTER — Ambulatory Visit
Admission: EM | Admit: 2019-01-27 | Discharge: 2019-01-27 | Disposition: A | Discharge status: Home / Self Care | Attending: Emergency Medicine | Admitting: Emergency Medicine

## 2019-01-27 ENCOUNTER — Other Ambulatory Visit: Payer: Self-pay

## 2019-01-27 DIAGNOSIS — Z20822 Contact with and (suspected) exposure to covid-19: Secondary | ICD-10-CM

## 2019-01-27 HISTORY — DX: Pneumonia, unspecified organism: J18.9

## 2019-01-27 MED ORDER — PREDNISONE 10 MG (21) PO TBPK: ORAL_TABLET | Freq: Every day | ORAL | 0 refills | Status: DC

## 2019-01-27 MED ORDER — ALBUTEROL SULFATE HFA 108 (90 BASE) MCG/ACT IN AERS: 1.0000 | INHALATION_SPRAY | Freq: Four times a day (QID) | RESPIRATORY_TRACT | 0 refills | Status: DC | PRN

## 2019-01-27 MED ORDER — BENZONATATE 100 MG PO CAPS: 100.0000 mg | ORAL_CAPSULE | Freq: Three times a day (TID) | ORAL | 0 refills | Status: DC

## 2019-01-27 NOTE — ED Provider Notes (Signed)
[facility]    CSN: [id number] Arrival date & time: 01/27/19  1621      History   Chief Complaint Chief Complaint  Patient presents with  . persistent cough    HPI Aaron Hunt is a 44 y.o. male.   Aaron Hunt 44 years old male presented to the urgent care with a complaint of cough congestion and loss of voice for the past 3 to 4 weeks.  Patient was reportedly treated for pneumonia couple weeks ago with Levaquin.  Denies sick exposure to COVID, flu or strep.  Denies recent travel.  Denies aggravating or alleviating symptoms.  Denies previous COVID infection.   Denies fever, chills, fatigue,  rhinorrhea, sore throat,  SOB, wheezing, chest pain, nausea, vomiting, changes in bowel or bladder habits.       Past Medical History:  Diagnosis Date  . Back pain   . MRSA (methicillin resistant staph aureus) culture positive   . Pituitary mass ([place])   . Pneumonia     There are no problems to display for this patient.   Past Surgical History:  Procedure Laterality Date  . BACK SURGERY    . EYE SURGERY Bilateral   . finger surgery Right    littl;e finger x2  . FOOT SURGERY Right   . KNEE SURGERY Left        Home Medications    Prior to Admission medications   Medication Sig Start Date End Date Taking? Authorizing Provider  albuterol (VENTOLIN HFA) 108 (90 Base) MCG/ACT inhaler Inhale 1-2 puffs into the lungs every 6 (six) hours as needed for wheezing or shortness of breath. 01/27/19   Christiaan Strebeck, Darrelyn Hillock, FNP  amphetamine-dextroamphetamine (ADDERALL) 20 MG tablet Take 1 tablet by mouth 2 (two) times a day. 06/09/18   [provider]  benzonatate (TESSALON) 100 MG capsule Take 1 capsule (100 mg total) by mouth every 8 (eight) hours. 01/27/19   Hassel Uphoff, Darrelyn Hillock, FNP  cetirizine (ZYRTEC) 10 MG tablet Take 1 tablet by mouth daily. 05/28/18   [provider]  fluticasone (FLONASE) 50 MCG/ACT nasal spray Place 2 sprays into both nostrils daily as  needed.  05/28/18   [provider]  HYDROcodone-homatropine (HYCODAN) 5-1.5 MG/5ML syrup Take 5 mLs by mouth every 12 (twelve) hours as needed for cough. 01/15/19   Wurst, [place], PA-C  ibuprofen (ADVIL) 800 MG tablet Take 1 tablet by mouth 2 (two) times daily as needed. 05/28/18   [provider]  lamoTRIgine (LAMICTAL) 200 MG tablet Take 1 tablet by mouth daily. 06/01/18   [provider]  metFORMIN (GLUCOPHAGE-XR) 500 MG 24 hr tablet Take 1 tablet by mouth 2 (two) times a day. 05/28/18   [provider]  predniSONE (STERAPRED UNI-PAK 21 TAB) 10 MG (21) TBPK tablet Take by mouth daily. Take 6 tabs by mouth daily  for 2 days, then 5 tabs for 2 days, then 4 tabs for 2 days, then 3 tabs for 2 days, 2 tabs for 2 days, then 1 tab by mouth daily for 2 days 01/27/19   Emerson Monte, FNP  Testosterone 10 MG/ACT (2%) GEL Apply 3 Pump topically daily.  06/11/18   [provider]  TRICOR 48 MG tablet Take 1 tablet by mouth daily. 05/01/18   [provider]    Family History Family History  Problem Relation Age of Onset  . Cancer Mother   . Cancer Father     Social History Social History   Tobacco Use  .  Smoking status: Former Smoker    Packs/day: 1.00    Years: 26.00    Pack years: 26.00    Types: Cigarettes    Quit date: 12/13/2014    Years since quitting: 4.1  . Smokeless tobacco: Former Network engineer Use Topics  . Alcohol use: Not Currently    Comment: occasioanal  . Drug use: Never     Allergies   Patient has no known allergies.   Review of Systems Review of Systems  Constitutional: Negative.   HENT: Positive for congestion.        Lost of voice  Respiratory: Positive for cough.   Cardiovascular: Negative.   Gastrointestinal: Negative.   Neurological: Negative.   All other systems reviewed and are negative.    Physical Exam Triage Vital Signs ED Triage Vitals  Enc Vitals Group     BP      Pulse      Resp       Temp      Temp src      SpO2      Weight      Height      Head Circumference      Peak Flow      Pain Score      Pain Loc      Pain Edu?      Excl. in [place]?    No data found.  Updated Vital Signs BP 131/84 (BP Location: Right Arm)   Pulse 74   Temp 98.7 F (37.1 C) (Oral)   Resp 18   SpO2 98%   Visual Acuity Right Eye Distance:   Left Eye Distance:   Bilateral Distance:    Right Eye Near:   Left Eye Near:    Bilateral Near:     Physical Exam Vitals and nursing note reviewed.  Constitutional:      General: He is not in acute distress.    Appearance: Normal appearance. He is normal weight. He is not ill-appearing or toxic-appearing.  HENT:     Head: Normocephalic.     Right Ear: Tympanic membrane, ear canal and external ear normal. There is no impacted cerumen.     Left Ear: Tympanic membrane, ear canal and external ear normal. There is no impacted cerumen.     Nose: Nose normal. No congestion.     Mouth/Throat:     Mouth: Mucous membranes are moist.     Pharynx: Oropharynx is clear. No oropharyngeal exudate or posterior oropharyngeal erythema.  Cardiovascular:     Rate and Rhythm: Normal rate and regular rhythm.     Pulses: Normal pulses.     Heart sounds: Normal heart sounds. No murmur.  Pulmonary:     Effort: Pulmonary effort is normal. No respiratory distress.     Breath sounds: Normal breath sounds. No wheezing or rhonchi.  Chest:     Chest wall: No tenderness.  Abdominal:     General: Abdomen is flat. Bowel sounds are normal. There is no distension.     Palpations: There is no mass.     Tenderness: There is no abdominal tenderness.  Skin:    Capillary Refill: Capillary refill takes less than 2 seconds.  Neurological:     General: No focal deficit present.     Mental Status: He is alert and oriented to person, place, and time.      UC Treatments / Results  Labs (all labs ordered are listed, but only abnormal results are displayed) Labs Reviewed  NOVEL CORONAVIRUS, NAA    EKG   Radiology No results found.  Procedures Procedures (including critical care time)  Medications Ordered in UC Medications - No data to display  Initial Impression / Assessment and Plan / UC Course  I have reviewed the triage vital signs and the nursing notes.  Pertinent labs & imaging results that were available during my care of the patient were reviewed by me and considered in my medical decision making (see chart for details).    COVID-19 test was ordered Advised patient to quarantine Prednisone prescribed for inflammation Tessalon Perles for cough Albuterol was refilled To go to ED for worsening of symptoms Patient verbalized understanding the plan of care  Final Clinical Impressions(s) / UC Diagnoses   Final diagnoses:  Suspected COVID-19 virus infection     Discharge Instructions     COVID testing ordered.  It will take between 2-7 days for test results.  Someone will contact you regarding abnormal results.    In the meantime: You should remain isolated in your home for 10 days from symptom onset AND greater than 72 hours after symptoms resolution (absence of fever without the use of fever-reducing medication and improvement in respiratory symptoms), whichever is longer Get plenty of rest and push fluids Tessalon Perles prescribed for cough Flonase prescribed for nasal congestion and runny nose Use medications daily for symptom relief Use OTC medications like ibuprofen or tylenol as needed fever or pain Call or go to the ED if you have any new or worsening symptoms such as fever, worsening cough, shortness of breath, chest tightness, chest pain, turning blue, changes in mental status, etc...     ED Prescriptions    Medication Sig Dispense Auth. Provider   benzonatate (TESSALON) 100 MG capsule Take 1 capsule (100 mg total) by mouth every 8 (eight) hours. 21 capsule Starnisha Batrez S, FNP   albuterol (VENTOLIN HFA) 108 (90  Base) MCG/ACT inhaler Inhale 1-2 puffs into the lungs every 6 (six) hours as needed for wheezing or shortness of breath. 18 g Demaurion Dicioccio S, FNP   predniSONE (STERAPRED UNI-PAK 21 TAB) 10 MG (21) TBPK tablet Take by mouth daily. Take 6 tabs by mouth daily  for 2 days, then 5 tabs for 2 days, then 4 tabs for 2 days, then 3 tabs for 2 days, 2 tabs for 2 days, then 1 tab by mouth daily for 2 days 42 tablet Gaelyn Tukes, Darrelyn Hillock, FNP     PDMP not reviewed this encounter.   Emerson Monte, FNP 01/27/19 1700

## 2019-01-27 NOTE — ED Triage Notes (Signed)
Pt presents to UC w/ c/o congestion, cough, loss of voice. Pt has been dealing w/ these issues for about 1 month. Chest xray from previous visit showed RLL pneumonia. ABx finished 1 week ago.

## 2019-01-27 NOTE — Discharge Instructions (Addendum)
COVID testing ordered.  It will take between 2-7 days for test results.  Someone will contact you regarding abnormal results.    In the meantime: You should remain isolated in your home for 10 days from symptom onset AND greater than 72 hours after symptoms resolution (absence of fever without the use of fever-reducing medication and improvement in respiratory symptoms), whichever is longer Get plenty of rest and push fluids Tessalon Perles prescribed for cough Flonase prescribed for nasal congestion and runny nose Use medications daily for symptom relief Use OTC medications like ibuprofen or tylenol as needed fever or pain Call or go to the ED if you have any new or worsening symptoms such as fever, worsening cough, shortness of breath, chest tightness, chest pain, turning blue, changes in mental status, etc..Marland Kitchen

## 2019-01-28 LAB — NOVEL CORONAVIRUS, NAA: SARS-CoV-2, NAA: NOT DETECTED

## 2019-02-21 LAB — HEMOGLOBIN A1C: Hemoglobin A1C: 5.3

## 2019-03-18 ENCOUNTER — Other Ambulatory Visit: Payer: Self-pay

## 2019-03-18 ENCOUNTER — Ambulatory Visit (INDEPENDENT_AMBULATORY_CARE_PROVIDER_SITE_OTHER): Admitting: "Endocrinology

## 2019-03-18 ENCOUNTER — Encounter: Payer: Self-pay | Admitting: "Endocrinology

## 2019-03-18 VITALS — BP 123/78 | HR 83 | Ht 66.5 in | Wt 279.0 lb

## 2019-03-18 DIAGNOSIS — E119 Type 2 diabetes mellitus without complications: Secondary | ICD-10-CM | POA: Diagnosis not present

## 2019-03-18 DIAGNOSIS — E782 Mixed hyperlipidemia: Secondary | ICD-10-CM

## 2019-03-18 DIAGNOSIS — E291 Testicular hypofunction: Secondary | ICD-10-CM | POA: Diagnosis not present

## 2019-03-18 DIAGNOSIS — D352 Benign neoplasm of pituitary gland: Secondary | ICD-10-CM | POA: Insufficient documentation

## 2019-03-18 MED ORDER — METFORMIN HCL ER 500 MG PO TB24: 500.0000 mg | ORAL_TABLET | Freq: Every day | ORAL | 1 refills | Status: DC

## 2019-03-18 MED ORDER — TESTOSTERONE 10 MG/ACT (2%) TD GEL: 3.0000 | Freq: Every day | TRANSDERMAL | 0 refills | Status: DC

## 2019-03-18 NOTE — Patient Instructions (Signed)

## 2019-03-18 NOTE — Progress Notes (Signed)
Endocrinology Consult Note       03/18/2019, 1:07 PM   Subjective:    Patient ID: Aaron Hunt, male    DOB: 01/31/1975.  Aaron Hunt is being seen in consultation for history of pituitary microadenoma, management of currently controlled asymptomatic type 2 diabetes, hyperlipidemia, hypogonadism. PCP : Aaron Hunt   Past Medical History:  Diagnosis Date  . Back pain   . MRSA (methicillin resistant staph aureus) culture positive   . Pituitary mass ([place])   . Pneumonia     Past Surgical History:  Procedure Laterality Date  . BACK SURGERY    . EYE SURGERY Bilateral   . finger surgery Right    littl;e finger x2  . FOOT SURGERY Right   . KNEE SURGERY Left     Social History   Socioeconomic History  . Marital status: Married    Spouse name: Not on file  . Number of children: Not on file  . Years of education: Not on file  . Highest education level: Not on file  Occupational History  . Not on file  Tobacco Use  . Smoking status: Former Smoker    Packs/day: 1.00    Years: 26.00    Pack years: 26.00    Types: Cigarettes    Quit date: 12/13/2014    Years since quitting: 4.2  . Smokeless tobacco: Former Network engineer and Sexual Activity  . Alcohol use: Not Currently    Comment: occasioanal  . Drug use: Never  . Sexual activity: Not on file  Other Topics Concern  . Not on file  Social History Narrative  . Not on file   Social Determinants of Health   Financial Resource Strain:   . Difficulty of Paying Living Expenses:   Food Insecurity:   . Worried About Charity fundraiser in the Last Year:   . Arboriculturist in the Last Year:   Transportation Needs:   . Film/video editor (Medical):   Marland Kitchen Lack of Transportation (Non-Medical):   Physical Activity:   . Days of Exercise per Week:   . Minutes of Exercise per Session:   Stress:   . Feeling of Stress :   Social  Connections:   . Frequency of Communication with Friends and Family:   . Frequency of Social Gatherings with Friends and Family:   . Attends Religious Services:   . Active Member of Clubs or Organizations:   . Attends Archivist Meetings:   Marland Kitchen Marital Status:     Family History  Problem Relation Age of Onset  . Cancer Mother   . Diabetes Mother   . Cancer Father     Outpatient Encounter Medications as of 03/18/2019  Medication Sig  . amphetamine-dextroamphetamine (ADDERALL) 20 MG tablet Take 1 tablet by mouth 2 (two) times a day.  . cetirizine (ZYRTEC) 10 MG tablet Take 1 tablet by mouth daily.  . fluticasone (FLONASE) 50 MCG/ACT nasal spray Place 2 sprays into both nostrils daily as needed.   . hydrOXYzine (ATARAX/VISTARIL) 50 MG tablet Take 50-100 mg by mouth at bedtime as needed.  Marland Kitchen ibuprofen (ADVIL)  800 MG tablet Take 1 tablet by mouth 2 (two) times daily as needed.  . lamoTRIgine (LAMICTAL) 200 MG tablet Take 1 tablet by mouth daily.  . metFORMIN (GLUCOPHAGE-XR) 500 MG 24 hr tablet Take 1 tablet (500 mg total) by mouth daily after breakfast.  . Testosterone 10 MG/ACT (2%) GEL Apply 3 Pump topically daily.  . TRICOR 48 MG tablet Take 1 tablet by mouth daily.  . [DISCONTINUED] albuterol (VENTOLIN HFA) 108 (90 Base) MCG/ACT inhaler Inhale 1-2 puffs into the lungs every 6 (six) hours as needed for wheezing or shortness of breath.  . [DISCONTINUED] benzonatate (TESSALON) 100 MG capsule Take 1 capsule (100 mg total) by mouth every 8 (eight) hours.  . [DISCONTINUED] HYDROcodone-homatropine (HYCODAN) 5-1.5 MG/5ML syrup Take 5 mLs by mouth every 12 (twelve) hours as needed for cough.  . [DISCONTINUED] metFORMIN (GLUCOPHAGE-XR) 500 MG 24 hr tablet Take 1 tablet by mouth 2 (two) times a day.  . [DISCONTINUED] predniSONE (STERAPRED UNI-PAK 21 TAB) 10 MG (21) TBPK tablet Take by mouth daily. Take 6 tabs by mouth daily  for 2 days, then 5 tabs for 2 days, then 4 tabs for 2 days, then 3  tabs for 2 days, 2 tabs for 2 days, then 1 tab by mouth daily for 2 days  . [DISCONTINUED] Testosterone 10 MG/ACT (2%) GEL Apply 3 Pump topically daily.    No facility-administered encounter medications on file as of 03/18/2019.    ALLERGIES: No Known Allergies  VACCINATION STATUS:  There is no immunization history on file for this patient.  Diabetes He presents for his initial diabetic visit. He has type 2 diabetes mellitus. His disease course has been stable. There are no hypoglycemic associated symptoms. Pertinent negatives for hypoglycemia include no confusion, headaches, pallor or seizures. There are no diabetic associated symptoms. Pertinent negatives for diabetes include no chest pain, no fatigue, no polydipsia, no polyphagia, no polyuria and no weakness. There are no hypoglycemic complications. Symptoms are stable. There are no diabetic complications. Risk factors for coronary artery disease include diabetes mellitus, dyslipidemia, male sex, obesity, tobacco exposure and sedentary lifestyle. Current diabetic treatment includes oral agent (monotherapy). His weight is increasing steadily. He is following a generally unhealthy diet. When asked about meal planning, he reported none. He participates in exercise intermittently. (He did not bring any logs nor meter to review.  His recent A1c was 5.3%.) An ACE inhibitor/angiotensin II receptor blocker is not being taken.  Hyperlipidemia This is a chronic problem. The current episode started more than 1 year ago. Pertinent negatives include no chest pain, myalgias or shortness of breath. Current antihyperlipidemic treatment includes fibric acid derivatives. Risk factors for coronary artery disease include dyslipidemia, diabetes mellitus, male sex, obesity, a sedentary lifestyle and family history.   Hypogonadism: He was diagnosed with hypogonadism at approximately age 44, has been on testosterone supplement intermittently for the last 5 to 6 years.   He is most recent testosterone level was 136 on February 21, 2019 after skipping some of his topical doses.  He was 865 in March 2020.  Pituitary microadenoma: First discovered in 2016 at which time was measuring 8 x 8 x 10 mm-a total of 1 of 640 mm3,   complete endocrine evaluation was negative.  Repeat MRI in 2028 showed measurements dropping to 8 x 7 x 9 mm-total volume of 504 mm3.   Review of Systems  Constitutional: Negative for chills, fatigue, fever and unexpected weight change.  HENT: Negative for dental problem, mouth sores and  trouble swallowing.   Eyes: Negative for visual disturbance.  Respiratory: Negative for cough, choking, chest tightness, shortness of breath and wheezing.   Cardiovascular: Negative for chest pain, palpitations and leg swelling.  Gastrointestinal: Negative for abdominal distention, abdominal pain, constipation, diarrhea, nausea and vomiting.  Endocrine: Negative for polydipsia, polyphagia and polyuria.  Genitourinary: Negative for dysuria, flank pain, hematuria and urgency.  Musculoskeletal: Negative for back pain, gait problem, myalgias and neck pain.  Skin: Negative for pallor, rash and wound.  Neurological: Negative for seizures, syncope, weakness, numbness and headaches.  Psychiatric/Behavioral: Negative for confusion and dysphoric mood.    Objective:    Vitals with BMI 03/18/2019 01/27/2019 01/15/2019  Height 5' 6.5" - -  Weight 279 lbs - -  BMI 0000000 - -  Systolic [id number]  Diastolic 78 84 79  Pulse 83 74 -    BP 123/78   Pulse 83   Ht 5' 6.5" (1.689 m)   Wt 279 lb (126.6 kg)   BMI 44.36 kg/m   Wt Readings from Last 3 Encounters:  03/18/19 279 lb (126.6 kg)  06/27/18 270 lb (122.5 kg)     Physical Exam Constitutional:      General: He is not in acute distress.    Appearance: He is well-developed.  HENT:     Head: Normocephalic and atraumatic.  Neck:     Thyroid: No thyromegaly.     Trachea: No tracheal deviation.   Cardiovascular:     Rate and Rhythm: Normal rate.     Pulses:          Dorsalis pedis pulses are 1+ on the right side and 1+ on the left side.       Posterior tibial pulses are 1+ on the right side and 1+ on the left side.     Heart sounds: Normal heart sounds, S1 normal and S2 normal. No murmur. No gallop.   Pulmonary:     Effort: No respiratory distress.     Breath sounds: Normal breath sounds. No wheezing.  Abdominal:     General: Bowel sounds are normal. There is no distension.     Palpations: Abdomen is soft.     Tenderness: There is no abdominal tenderness. There is no guarding.  Musculoskeletal:     Right shoulder: No swelling or deformity.     Cervical back: Normal range of motion and neck supple.  Skin:    General: Skin is warm and dry.     Findings: No rash.     Nails: There is no clubbing.     Comments: Genital exam is unremarkable with testicles 25 cc bilaterally clinical exam is unremarkable instead of bursitis 25/CC bilaterally noscrotalmass.  No inguinal or femoral hernia.  Circumcised male.  Neurological:     Mental Status: He is alert and oriented to person, place, and time.     Cranial Nerves: No cranial nerve deficit.     Sensory: No sensory deficit.     Gait: Gait normal.     Deep Tendon Reflexes: Reflexes are normal and symmetric.  Psychiatric:        Speech: Speech normal.        Behavior: Behavior normal. Behavior is cooperative.        Thought Content: Thought content normal.        Judgment: Judgment normal.     Diabetic Labs (most recent): Lab Results  Component Value Date   HGBA1C 5.3 02/21/2019    Pituitary microadenoma 2018 MRI:  8 x 7 x 9 mm   Assessment & Plan:   1. Benign neoplasm of pituitary gland ([facility]) -2. Diabetes mellitus without complication ([place]) 3. Mixed hyperlipidemia 4. Hypogonadism, male   1)  Regarding his pituitary microadenoma: Was previously worked up and found to be nonfunctional.  He would not need any MRI  evaluation at this time.  He will be considered for some basic endocrine work-up by measuring prolactin, thyroid function tests, a.m. cortisol.  2)  currently controlled type 2 diabetes with recent A1c of 5.3%.    Recent labs reviewed.  -He does not report any gross complications from his diabetes, however he remains at a high risk for more acute and chronic complications which include CAD, CVA, CKD, retinopathy, and neuropathy. These are all discussed in detail with him.  - I have counseled him on diet  and weight management  by adopting a carbohydrate restricted/protein rich diet. Patient is encouraged to switch to  unprocessed or minimally processed     complex starch and increased protein intake (animal or plant source), fruits, and vegetables. -  he is advised to stick to a routine mealtimes to eat 3 meals  a day and avoid unnecessary snacks ( to snack only to correct hypoglycemia).   - he admits that there is a room for improvement in his food and drink choices. - Suggestion is made for him to avoid simple carbohydrates  from his diet including Cakes, Sweet Desserts, Ice Cream, Soda (diet and regular), Sweet Tea, Candies, Chips, Cookies, Store Bought Juices, Alcohol in Excess of  1-2 drinks a day, Artificial Sweeteners,  Coffee Creamer, and "Sugar-free" Products. This will help patient to have more stable blood glucose profile and potentially avoid unintended weight gain.  - I have approached him with the following individualized plan to manage  his diabetes and patient agrees:   - he will not need any additional treatment.  He did have some side effects from regular strength metformin.  He is offered extended release Metformin,  Metformin 500 mg extended release daily after breakfast.   - he will be considered for incretin therapy as appropriate next visit.  - Specific targets for  A1c;  LDL, HDL,  and Triglycerides were discussed with the patient.  3) Blood Pressure /Hypertension:  his  blood pressure is  controlled to target.  He is not on any antihypertensive medications at this time.  4) Lipids/Hyperlipidemia: He does not have recent lipid panel to review.  He will be considered for fasting lipid panel before his next visit.  He is advised to continue TriCor 48 mg p.o. daily.    5)  Weight/Diet:  Body mass index is 44.36 kg/m.  -   clearly complicating his diabetes care.   he is  a candidate for weight loss. I discussed with him the fact that loss of 5 - 10% of his  current body weight will have the most impact on his diabetes management.  Exercise, and detailed carbohydrates information provided  -  detailed on discharge instructions.  6) hypogonadism: I have reviewed his previous total testosterone measurements.  He did have testosterone as low as 66 in 2017.  Most recently, after skipping some topical applications his total testosterone of 136.  His genital exam is unremarkable including normal sized testicles.  He will be continued on the same dose of testosterone at this time 30 mg oftestosterone gel daily with plan to measure total testosterone before his next visit.   Testosterone prescription  was refilled for him.  7) Chronic Care/Health Maintenance:  -he  is on not ACEI/ARB nor  Statin medications and  is encouraged to initiate and continue to follow up with Ophthalmology, Dentist,  Podiatrist at least yearly or according to recommendations, and advised to   stay away from smoking. I have recommended yearly flu vaccine and pneumonia vaccine at least every 5 years; moderate intensity exercise for up to 150 minutes weekly; and  sleep for at least 7 hours a day.  - he is  advised to maintain close follow up with Patient, No Pcp Per for primary care needs, as well as his other providers for optimal and coordinated care.   - Time spent in this patient care: 80 min, of which > 50% was spent in  counseling  him about his currently controlled type 2 diabetes, pituitary  microadenoma, hypothyroidism, hyperlipidemia, morbid obesity and the rest reviewing his blood glucose logs , discussing his hypoglycemia and hyperglycemia episodes, reviewing his current and  previous labs / studies  ( including abstraction from other facilities) and medications  doses and developing a  long term treatment plan based on the latest standards of care/ guidelines; and documenting his care.    Please refer to Patient Instructions for Blood Glucose Monitoring and Insulin/Medications Dosing Guide"  in media tab for additional information. Please  also refer to " Patient Self Inventory" in the Media  tab for reviewed elements of pertinent patient history.  Doreene Burke participated in the discussions, expressed understanding, and voiced agreement with the above plans.  All questions were answered to his satisfaction. he is encouraged to contact clinic should he have any questions or concerns prior to his return visit.   Follow up plan: - Return in about 3 months (around 06/18/2019) for Follow up with Pre-visit Labs, Next Visit A1c in Office.  Glade Lloyd, MD [facility] [address] [place], [place] [address] Phone: [phone]  Fax: [phone]    03/18/2019, 1:07 PM  This note was partially dictated with voice recognition software. Similar sounding words can be transcribed inadequately or may not  be corrected upon review.

## 2019-05-11 ENCOUNTER — Ambulatory Visit: Attending: Internal Medicine

## 2019-05-11 ENCOUNTER — Other Ambulatory Visit: Payer: Self-pay

## 2019-05-11 DIAGNOSIS — Z20822 Contact with and (suspected) exposure to covid-19: Secondary | ICD-10-CM

## 2019-05-12 LAB — NOVEL CORONAVIRUS, NAA: SARS-CoV-2, NAA: NOT DETECTED

## 2019-05-12 LAB — SARS-COV-2, NAA 2 DAY TAT

## 2019-05-25 NOTE — Progress Notes (Addendum)
Virtual Visit via Video Note  I connected with Aaron Hunt on 06/01/19 at 10:00 AM EDT by a video enabled telemedicine application and verified that I am speaking with the correct person using two identifiers.   I discussed the limitations of evaluation and management by telemedicine and the availability of in person appointments. The patient expressed understanding and agreed to proceed.    I discussed the assessment and treatment plan with the patient. The patient was provided an opportunity to ask questions and all were answered. The patient agreed with the plan and demonstrated an understanding of the instructions.   The patient was advised to call back or seek an in-person evaluation if the symptoms worsen or if the condition fails to improve as anticipated.  Location: patient- home, provider- home office   I provided 42 minutes of Aaron-face-to-face time during this encounter.   [name], MD      Psychiatric Initial Adult Assessment   Patient Identification: Aaron Hunt MRN:  [id number] Date of Evaluation:  06/01/2019 Referral Source: Dr. Simmie Davies Chief Complaint:   Visit Diagnosis:    ICD-10-CM   1. Hunt (post-traumatic stress Hunt)  F43.10   2. MDD (major depressive Hunt), recurrent, in full remission ([place])  F33.42   3. Attention deficit hyperactivity Hunt (ADHD), unspecified ADHD Aaron  F90.9     History of Present Illness:   Aaron Hunt, Aaron Hunt, Aaron Hunt, Aaron Hunt,  Aaron Hunt, Aaron Hunt, hypogonadism. , Aaron is referred for Hunt.   He states that he is hoping to establish care as he moved down from [place] in January.  He moved down as his wife's family live [place]. His wife takes care of her uncle and her grandfather. He likes this relocation and work so much better, stating that he is able to be around his family and his children. He has been  on the current medication regimen for over an year.   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 He reports loss of many people in the [employer]. He tries to get used to "gray" in civilian world, while it used to be black and white in [employer]. He states that he is trying to be aware of his irritability, stating that other people think of him as angry, while he does not realize it.  He states that he had a short temper when he missed to take medication when he visited [place] for funeral.  He tends to feel very anxious especially when he is in a crowd. He has other Hunt symptoms as below. He denies nightmares.   Depression- although he occasionally feels down at times, he denies other symptoms.  He enjoys being with his children.  He denies SI.   ADHD- He will get Bachelor in a month.  Although he definitely thinks Adderall has been helping for him, he thinks that he is lack in details.  He has difficulty in multitasking and prioritizing things.  He jumps from topic to topic.  He tends to be forgetful.  He denies any issues with an appointment, stating that he put reminder.  He has had difficulty at work due to these symptoms.   Substance- He had hard ice tea last night, 1-2 week, used to drink a lot in his 39s. Denies drug use   Medication- lamotrigine 200 mg BID, adderall 20 mg BID  Addendum:  [facility]. The note includes the most recent encounter on 03/10/2019. Diagnosis includes Hunt, ADHD, unspecified Aaron Hunt, Hunt. Medication- lamotrigine 200 mg BID, Adderall 20 mg BID Past trials of bupropion, lexapro, Adderall   Associated Signs/Symptoms: Depression Symptoms:  anxiety, (Hypo) Manic Symptoms:  denies decreased need for sleep, euphoria Anxiety Symptoms:  some anxiety Psychotic Symptoms:  denies AH, [facility] Hunt Symptoms: Had a traumatic exposure:  deployed to [place] Re-experiencing:  Flashbacks Intrusive Thoughts Hypervigilance:   Yes Hyperarousal:  Difficulty Concentrating Increased Startle Response Irritability/Anger Avoidance:  Decreased Interest/Participation  Past Psychiatric History:  Outpatient: in [place], diagnosed with depression ,Hunt, ADHD Psychiatry admission: denies  Previous suicide attempt: denies Past trials of medication: lamotrigine, Adderall, does not recall other medication History of violence: denies  Legal: in 2002 for fishing without license   Previous Psychotropic Medications: Yes   Substance Abuse History in the last 12 months:  No.  Consequences of Substance Abuse: NA  Past Medical History:  Past Medical History:  Diagnosis Date  . Back pain   . MRSA (methicillin resistant staph aureus) culture positive   . Pituitary mass ([place])   . Pneumonia     Past Surgical History:  Procedure Laterality Date  . BACK SURGERY    . EYE SURGERY Bilateral   . finger surgery Right    littl;e finger x2  . FOOT SURGERY Right   . KNEE SURGERY Left     Family Psychiatric History:  As below  Family History:  Family History  Problem Relation Age of Onset  . Cancer Mother   . Hunt Mother   . Cancer Father   . Depression Father   . Drug abuse Father     Social History:   Social History   Socioeconomic History  . Marital status: Married    Spouse name: Not on file  . Number of children: Not on file  . Years of education: Not on file  . Highest education level: Not on file  Occupational History  . Not on file  Tobacco Use  . Smoking status: Former Smoker    Packs/day: 1.00    Years: 26.00    Pack years: 26.00    Types: Cigarettes    Quit date: 12/13/2014    Years since quitting: 4.4  . Smokeless tobacco: Former Network engineer and Sexual Activity  . Alcohol use: Not Currently    Comment: occasioanal  . Drug use: Never  . Sexual activity: Not on file  Other Topics Concern  . Not on file  Social History Narrative  . Not on file   Social Determinants of Health    Financial Resource Strain:   . Difficulty of Paying Living Expenses:   Food Insecurity:   . Worried About Charity fundraiser in the Last Year:   . Arboriculturist in the Last Year:   Transportation Needs:   . Film/video editor (Medical):   Marland Kitchen Lack of Transportation (Aaron-Medical):   Physical Activity:   . Days of Exercise per Week:   . Minutes of Exercise per Session:   Stress:   . Feeling of Stress :   Social Connections:   . Frequency of Communication with Friends and Family:   . Frequency of Social Gatherings with Friends and Family:   . Attends Religious Services:   . Active Member of Clubs or Organizations:   . Attends Archivist Meetings:   Marland Kitchen Marital Status:     Additional Social History:  Daily  routine: work, works on garden, takes care of his chicken, enjoys fishing on weekend,  Employment: works M-Fri for [employer] since 04/01/14. Medically retired from [employer] 01-Apr-2014 due to back injury, and pituitary adenoma, served 18 years 58 months, deployed to [employer] a few times,  Household: wife, 33 and 4 year old children Marital status: since 04-01-2007, divorced in 01-Apr-2002 Number of children: 39,  47, 26 year old in [place] Education- will obtain bachelor degree in a month, using VA benefits, graduated from high school He states that his father was alcoholic, and the patient was "never good enough." He had estranged relationship with him, although he is trying again after he has children. He is cognizant of issues around alcohol, and tries to be a better person for his children. He has great relationship with his mother, Aaron deceased in 04-01-2006.   Allergies:  No Known Allergies  Metabolic Hunt Labs: Lab Results  Component Value Date   HGBA1C 5.3 02/21/2019   No results found for: PROLACTIN No results found for: CHOL, TRIG, HDL, CHOLHDL, VLDL, LDLCALC No results found for: TSH  Therapeutic Level Labs: No results found for: LITHIUM No results found for: CBMZ No  results found for: VALPROATE  Current Medications: Current Outpatient Medications  Medication Sig Dispense Refill  . [START ON 06/18/2019] amphetamine-dextroamphetamine (ADDERALL) 20 MG tablet Take 1 tablet (20 mg total) by mouth 2 (two) times daily. 60 tablet 0  . cetirizine (ZYRTEC) 10 MG tablet Take 1 tablet by mouth daily.    . fluticasone (FLONASE) 50 MCG/ACT nasal spray Place 2 sprays into both nostrils daily as needed.     . hydrOXYzine (ATARAX/VISTARIL) 50 MG tablet Take 50-100 mg by mouth at bedtime as needed.    Marland Kitchen ibuprofen (ADVIL) 800 MG tablet Take 1 tablet by mouth 2 (two) times daily as needed.    . lamoTRIgine (LAMICTAL) 200 MG tablet Take 1 tablet (200 mg total) by mouth 2 (two) times daily. 60 tablet 0  . metFORMIN (GLUCOPHAGE-XR) 500 MG 24 hr tablet Take 1 tablet (500 mg total) by mouth daily after breakfast. 90 tablet 1  . Testosterone 10 MG/ACT (2%) GEL Apply 3 Pump topically daily. 60 g 0  . TRICOR 48 MG tablet Take 1 tablet by mouth daily.     No current facility-administered medications for this visit.    Musculoskeletal: Strength & Muscle Tone: N/A Gait & Station: N/A Patient leans: N/A  Psychiatric Specialty Exam: Review of Systems  Psychiatric/Behavioral: Positive for decreased concentration. Negative for agitation, behavioral problems, confusion, dysphoric Aaron, hallucinations, self-injury, sleep disturbance and suicidal ideas. The patient is nervous/anxious. The patient is not hyperactive.   All other systems reviewed and are negative.   There were no vitals taken for this visit.There is no height or weight on file to calculate BMI.  General Appearance: Fairly Groomed  Eye Contact:  Good  Speech:  Clear and Coherent  Volume:  Normal  Aaron:  good  Affect:  Appropriate, Congruent and euthymic  Thought Process:  Coherent  Orientation:  Full (Time, Place, and Person)  Thought Content:  Logical  Suicidal Thoughts:  No  Homicidal Thoughts:  No  Memory:   Immediate;   Good  Judgement:  Good  Insight:  Good  Psychomotor Activity:  Normal  Concentration:  Concentration: Good and Attention Span: Good  Recall:  Good  Fund of Knowledge:Good  Language: Good  Akathisia:  No  Handed:  Right  AIMS (if indicated):  not done  Assets:  Communication Skills Desire for Improvement  ADL's:  Intact  Cognition: WNL  Sleep:  Good   Screenings:   Assessment and Plan:  Aaron Fagans is a 44 y.o. year old Hunt with a history of Hunt, Aaron Hunt, Aaron Hunt, Aaron Hunt,  Aaron Hunt, Aaron Hunt, hypogonadism. , Aaron is referred for Hunt.   1. Hunt (post-traumatic stress Hunt) 2. MDD (major depressive Hunt), recurrent, in full remission ([place]) Although he reports hypervigilance, irritability, and occasional flashback with anxiety, he has been managing things fairly well on his current medication regimen.  Will continue lamotrigine for Aaron dysregulation.  Discussed risk of Stevens-Johnson syndrome.  Will consider adding antidepressant in the future if any worsening in his Aaron symptoms.  We will obtain notes from his previous psychiatrist for further collaterals.   3. Attention deficit hyperactivity Hunt (ADHD), unspecified ADHD Aaron He reports difficulty in concentration, although he has good response from Adderall.  Will continue current dose of Adderall at this time.  Will consider adjusting this medication after obtaining collateral.  Discussed potential risk of palpitation, decreased appetite and worsening in anxiety.   Plan 1. Continue lamotrigine 200 mg twice a day 2. Continue Adderall 20 mg twice a day 3. Next appointment-  6/29 at 4 PM for 30 mins, video - obtain record from Dr. Simmie Davies  The patient demonstrates the following risk factors for suicide: Chronic risk factors for suicide include: psychiatric Hunt of depression, Hunt. Acute risk factors for suicide include: N/A. Protective  factors for this patient include: responsibility to others (children, family), coping skills and hope for the future. Considering these factors, the overall suicide risk at this point appears to be low. Patient is appropriate for outpatient follow up.    [name], MD 5/26/202111:01 AM

## 2019-06-01 ENCOUNTER — Telehealth (INDEPENDENT_AMBULATORY_CARE_PROVIDER_SITE_OTHER): Admitting: Psychiatry

## 2019-06-01 ENCOUNTER — Encounter (HOSPITAL_COMMUNITY): Payer: Self-pay | Admitting: Psychiatry

## 2019-06-01 ENCOUNTER — Other Ambulatory Visit: Payer: Self-pay

## 2019-06-01 DIAGNOSIS — F4310 Post-traumatic stress disorder, unspecified: Secondary | ICD-10-CM | POA: Diagnosis not present

## 2019-06-01 DIAGNOSIS — F3342 Major depressive disorder, recurrent, in full remission: Secondary | ICD-10-CM

## 2019-06-01 DIAGNOSIS — F909 Attention-deficit hyperactivity disorder, unspecified type: Secondary | ICD-10-CM

## 2019-06-01 MED ORDER — LAMOTRIGINE 200 MG PO TABS: 200.0000 mg | ORAL_TABLET | Freq: Two times a day (BID) | ORAL | 0 refills | Status: DC

## 2019-06-01 MED ORDER — AMPHETAMINE-DEXTROAMPHETAMINE 20 MG PO TABS: 20.0000 mg | ORAL_TABLET | Freq: Two times a day (BID) | ORAL | 0 refills | Status: DC

## 2019-06-01 NOTE — Patient Instructions (Signed)
1. Continue lamotrigine 200 mg twice a day 2. Continue Adderall 20 mg twice a day 3. Next appointment- 6/29 at 4 PM

## 2019-06-03 ENCOUNTER — Encounter (HOSPITAL_COMMUNITY): Payer: Self-pay | Admitting: Psychiatry

## 2019-06-03 NOTE — Telephone Encounter (Signed)
This encounter was created in error - please disregard.

## 2019-06-20 LAB — COMPLETE METABOLIC PANEL WITH GFR
AG Ratio: 2.3 (calc) (ref 1.0–2.5)
ALT: 42 U/L (ref 9–46)
AST: 24 U/L (ref 10–40)
Albumin: 4.6 g/dL (ref 3.6–5.1)
Alkaline phosphatase (APISO): 43 U/L (ref 36–130)
BUN: 16 mg/dL (ref 7–25)
CO2: 26 mmol/L (ref 20–32)
Calcium: 9.4 mg/dL (ref 8.6–10.3)
Chloride: 107 mmol/L (ref 98–110)
Creat: 0.82 mg/dL (ref 0.60–1.35)
GFR, Est African American: 126 mL/min/{1.73_m2} (ref >=60)
GFR, Est Non African American: 109 mL/min/{1.73_m2} (ref >=60)
Globulin: 2 g/dL (calc) (ref 1.9–3.7)
Glucose, Bld: 94 mg/dL (ref 65–99)
Potassium: 4.3 mmol/L (ref 3.5–5.3)
Sodium: 139 mmol/L (ref 135–146)
Total Bilirubin: 0.4 mg/dL (ref 0.2–1.2)
Total Protein: 6.6 g/dL (ref 6.1–8.1)

## 2019-06-20 LAB — LIPID PANEL
Cholesterol: 182 mg/dL (ref <200)
HDL: 44 mg/dL (ref >=40)
LDL Cholesterol (Calc): 116 mg/dL (calc) — ABNORMAL HIGH
Non-HDL Cholesterol (Calc): 138 mg/dL (calc) — ABNORMAL HIGH (ref <130)
Total CHOL/HDL Ratio: 4.1 (calc) (ref <5.0)
Triglycerides: 116 mg/dL (ref <150)

## 2019-06-20 LAB — CBC WITH DIFFERENTIAL/PLATELET
Absolute Monocytes: 626 cells/uL (ref 200–950)
Basophils Absolute: 109 cells/uL (ref 0–200)
Basophils Relative: 1.6 %
Eosinophils Absolute: 299 cells/uL (ref 15–500)
Eosinophils Relative: 4.4 %
HCT: 46.1 % (ref 38.5–50.0)
Hemoglobin: 15.8 g/dL (ref 13.2–17.1)
Lymphs Abs: 2672 cells/uL (ref 850–3900)
MCH: 28.6 pg (ref 27.0–33.0)
MCHC: 34.3 g/dL (ref 32.0–36.0)
MCV: 83.5 fL (ref 80.0–100.0)
MPV: 9.3 fL (ref 7.5–12.5)
Monocytes Relative: 9.2 %
Neutro Abs: 3094 cells/uL (ref 1500–7800)
Neutrophils Relative %: 45.5 %
Platelets: 244 10*3/uL (ref 140–400)
RBC: 5.52 10*6/uL (ref 4.20–5.80)
RDW: 13.1 % (ref 11.0–15.0)
Total Lymphocyte: 39.3 %
WBC: 6.8 10*3/uL (ref 3.8–10.8)

## 2019-06-20 LAB — MICROALBUMIN / CREATININE URINE RATIO
Creatinine, Urine: 85 mg/dL (ref 20–320)
Microalb, Ur: <0.2 mg/dL

## 2019-06-20 LAB — CORTISOL: Cortisol, Plasma: 7.5 ug/dL

## 2019-06-20 LAB — TESTOSTERONE TOTAL,FREE,BIO, MALES
Albumin: 4.6 g/dL (ref 3.6–5.1)
Sex Hormone Binding: 26 nmol/L (ref 10–50)
Testosterone, Bioavailable: 131.1 ng/dL (ref >=110.0)
Testosterone, Free: 62.4 pg/mL (ref 46.0–224.0)
Testosterone: 398 ng/dL (ref 250–827)

## 2019-06-20 LAB — PSA: PSA: 0.4 ng/mL (ref <=4.0)

## 2019-06-20 LAB — TSH: TSH: 1.5 mIU/L (ref 0.40–4.50)

## 2019-06-20 LAB — T4, FREE: Free T4: 1.2 ng/dL (ref 0.8–1.8)

## 2019-06-20 LAB — PROLACTIN: Prolactin: 2.9 ng/mL (ref 2.0–18.0)

## 2019-06-23 ENCOUNTER — Other Ambulatory Visit: Payer: Self-pay | Admitting: "Endocrinology

## 2019-06-24 ENCOUNTER — Encounter: Payer: Self-pay | Admitting: "Endocrinology

## 2019-06-24 ENCOUNTER — Ambulatory Visit (INDEPENDENT_AMBULATORY_CARE_PROVIDER_SITE_OTHER): Admitting: "Endocrinology

## 2019-06-24 ENCOUNTER — Other Ambulatory Visit: Payer: Self-pay

## 2019-06-24 VITALS — BP 133/83 | HR 80 | Ht 66.5 in | Wt 272.8 lb

## 2019-06-24 DIAGNOSIS — D352 Benign neoplasm of pituitary gland: Secondary | ICD-10-CM | POA: Diagnosis not present

## 2019-06-24 DIAGNOSIS — E782 Mixed hyperlipidemia: Secondary | ICD-10-CM | POA: Diagnosis not present

## 2019-06-24 DIAGNOSIS — E291 Testicular hypofunction: Secondary | ICD-10-CM | POA: Diagnosis not present

## 2019-06-24 DIAGNOSIS — E119 Type 2 diabetes mellitus without complications: Secondary | ICD-10-CM | POA: Diagnosis not present

## 2019-06-24 LAB — POCT GLYCOSYLATED HEMOGLOBIN (HGB A1C): Hemoglobin A1C: 5.4 % (ref 4.0–5.6)

## 2019-06-24 MED ORDER — TESTOSTERONE 10 MG/ACT (2%) TD GEL: TRANSDERMAL | 0 refills | Status: DC

## 2019-06-24 MED ORDER — ROSUVASTATIN CALCIUM 10 MG PO TABS
10.0000 mg | ORAL_TABLET | Freq: Every day | ORAL | 1 refills | Status: DC
Start: 2019-06-24 — End: 2020-01-09

## 2019-06-24 NOTE — Progress Notes (Signed)
06/24/2019, 1:42 PM  Endocrinology follow-up note  Subjective:    Patient ID: Aaron Hunt, male    DOB: August 26, 1975.  Aaron Hunt is being seen in follow-up after he was seen in consultation for history of pituitary microadenoma, management of currently controlled asymptomatic type 2 diabetes, hyperlipidemia, hypogonadism. PCP : Monico Blitz   Past Medical History:  Diagnosis Date  . Back pain   . MRSA (methicillin resistant staph aureus) culture positive   . Pituitary mass ([place])   . Pneumonia     Past Surgical History:  Procedure Laterality Date  . BACK SURGERY    . EYE SURGERY Bilateral   . finger surgery Right    littl;e finger x2  . FOOT SURGERY Right   . KNEE SURGERY Left     Social History   Socioeconomic History  . Marital status: Married    Spouse name: Not on file  . Number of children: Not on file  . Years of education: Not on file  . Highest education level: Not on file  Occupational History  . Not on file  Tobacco Use  . Smoking status: Former Smoker    Packs/day: 1.00    Years: 26.00    Pack years: 26.00    Types: Cigarettes    Quit date: 12/13/2014    Years since quitting: 4.5  . Smokeless tobacco: Former Network engineer  . Vaping Use: Never used  Substance and Sexual Activity  . Alcohol use: Not Currently    Comment: occasioanal  . Drug use: Never  . Sexual activity: Not on file  Other Topics Concern  . Not on file  Social History Narrative  . Not on file   Social Determinants of Health   Financial Resource Strain:   . Difficulty of Paying Living Expenses:   Food Insecurity:   . Worried About Charity fundraiser in the Last Year:   . Arboriculturist in the Last Year:   Transportation Needs:   . Film/video editor (Medical):   Marland Kitchen Lack of Transportation (Non-Medical):   Physical Activity:   . Days of Exercise per Week:   . Minutes of Exercise  per Session:   Stress:   . Feeling of Stress :   Social Connections:   . Frequency of Communication with Friends and Family:   . Frequency of Social Gatherings with Friends and Family:   . Attends Religious Services:   . Active Member of Clubs or Organizations:   . Attends Archivist Meetings:   Marland Kitchen Marital Status:     Family History  Problem Relation Age of Onset  . Cancer Mother   . Diabetes Mother   . Cancer Father   . Depression Father   . Drug abuse Father     Outpatient Encounter Medications as of 06/24/2019  Medication Sig  . amphetamine-dextroamphetamine (ADDERALL) 20 MG tablet Take 1 tablet (20 mg total) by mouth 2 (two) times daily.  . cetirizine (ZYRTEC) 10 MG tablet Take 1 tablet by mouth daily.  . fluticasone (FLONASE) 50 MCG/ACT nasal spray Place 2 sprays into both nostrils  daily as needed.   . hydrOXYzine (ATARAX/VISTARIL) 50 MG tablet Take 50-100 mg by mouth at bedtime as needed.  Marland Kitchen ibuprofen (ADVIL) 800 MG tablet Take 1 tablet by mouth 2 (two) times daily as needed.  . lamoTRIgine (LAMICTAL) 200 MG tablet Take 1 tablet (200 mg total) by mouth 2 (two) times daily.  . metFORMIN (GLUCOPHAGE-XR) 500 MG 24 hr tablet Take 1 tablet (500 mg total) by mouth daily after breakfast.  . rosuvastatin (CRESTOR) 10 MG tablet Take 1 tablet (10 mg total) by mouth daily.  . Testosterone 10 MG/ACT (2%) GEL APPLY 3 PUMPS TO THE SKIN ONCE DAILY  . TRICOR 48 MG tablet Take 1 tablet by mouth daily.  . [DISCONTINUED] Testosterone 10 MG/ACT (2%) GEL APPLY 3 PUMPS TO THE SKIN ONCE DAILY   No facility-administered encounter medications on file as of 06/24/2019.    ALLERGIES: No Known Allergies  VACCINATION STATUS:  There is no immunization history on file for this patient.  Diabetes He presents for his follow-up diabetic visit. He has type 2 diabetes mellitus. His disease course has been improving. There are no hypoglycemic associated symptoms. Pertinent negatives for  hypoglycemia include no confusion, headaches, pallor or seizures. There are no diabetic associated symptoms. Pertinent negatives for diabetes include no chest pain, no fatigue, no polydipsia, no polyphagia, no polyuria and no weakness. There are no hypoglycemic complications. Symptoms are improving. There are no diabetic complications. Risk factors for coronary artery disease include diabetes mellitus, dyslipidemia, male sex, obesity, tobacco exposure and sedentary lifestyle. Current diabetic treatment includes oral agent (monotherapy). He is compliant with treatment most of the time. His weight is decreasing steadily. He is following a generally unhealthy diet. When asked about meal planning, he reported none. He participates in exercise intermittently. There is no change in his home blood glucose trend. (His point-of-care A1c is 5.4% consistent with well-controlled diabetes type 2.   ) An ACE inhibitor/angiotensin II receptor blocker is not being taken.  Hyperlipidemia This is a chronic problem. The current episode started more than 1 year ago. Pertinent negatives include no chest pain, myalgias or shortness of breath. Current antihyperlipidemic treatment includes fibric acid derivatives. Risk factors for coronary artery disease include dyslipidemia, diabetes mellitus, male sex, obesity, a sedentary lifestyle and family history.   Hypogonadism: He was diagnosed with hypogonadism at approximately age 56, has been on testosterone supplement intermittently for the last 5 to 6 years.  His most recent testosterone level is  398.  Pituitary microadenoma: First discovered in 2016 at which time was measuring 8 x 8 x 10 mm-a total of 1 of 640 mm3,   complete endocrine evaluation was negative.  Repeat MRI in 2028 showed measurements dropping to 8 x 7 x 9 mm-total volume of 504 mm3.   Review of Systems  Constitutional: Negative for chills, fatigue, fever and unexpected weight change.  HENT: Negative for dental  problem, mouth sores and trouble swallowing.   Eyes: Negative for visual disturbance.  Respiratory: Negative for cough, choking, chest tightness, shortness of breath and wheezing.   Cardiovascular: Negative for chest pain, palpitations and leg swelling.  Gastrointestinal: Negative for abdominal distention, abdominal pain, constipation, diarrhea, nausea and vomiting.  Endocrine: Negative for polydipsia, polyphagia and polyuria.  Genitourinary: Negative for dysuria, flank pain, hematuria and urgency.  Musculoskeletal: Negative for back pain, gait problem, myalgias and neck pain.  Skin: Negative for pallor, rash and wound.  Neurological: Negative for seizures, syncope, weakness, numbness and headaches.  Psychiatric/Behavioral: Negative for confusion  and dysphoric mood.    Objective:    Vitals with BMI 06/24/2019 03/18/2019 01/27/2019  Height 5' 6.5" 5' 6.5" -  Weight 272 lbs 13 oz 279 lbs -  BMI 52.77 82.42 -  Systolic 353 614 431  Diastolic 83 78 84  Pulse 80 83 74    BP 133/83   Pulse 80   Ht 5' 6.5" (1.689 m)   Wt 272 lb 12.8 oz (123.7 kg)   BMI 43.37 kg/m   Wt Readings from Last 3 Encounters:  06/24/19 272 lb 12.8 oz (123.7 kg)  03/18/19 279 lb (126.6 kg)  06/27/18 270 lb (122.5 kg)     Physical Exam Constitutional:      General: He is not in acute distress.    Appearance: He is well-developed.  HENT:     Head: Normocephalic and atraumatic.  Neck:     Thyroid: No thyromegaly.     Trachea: No tracheal deviation.  Cardiovascular:     Rate and Rhythm: Normal rate.     Pulses:          Dorsalis pedis pulses are 1+ on the right side and 1+ on the left side.       Posterior tibial pulses are 1+ on the right side and 1+ on the left side.     Heart sounds: Normal heart sounds, S1 normal and S2 normal. No murmur heard.  No gallop.   Pulmonary:     Effort: No respiratory distress.     Breath sounds: Normal breath sounds. No wheezing.  Abdominal:     General: Bowel sounds  are normal. There is no distension.     Palpations: Abdomen is soft.     Tenderness: There is no abdominal tenderness. There is no guarding.  Musculoskeletal:     Right shoulder: No swelling or deformity.     Cervical back: Normal range of motion and neck supple.  Skin:    General: Skin is warm and dry.     Findings: No rash.     Nails: There is no clubbing.     Comments: Genital exam is unremarkable with testicles 25 cc bilaterally clinical exam is unremarkable instead of bursitis 25/CC bilaterally noscrotalmass.  No inguinal or femoral hernia.  Circumcised male.  Neurological:     Mental Status: He is alert and oriented to person, place, and time.     Cranial Nerves: No cranial nerve deficit.     Sensory: No sensory deficit.     Gait: Gait normal.     Deep Tendon Reflexes: Reflexes are normal and symmetric.  Psychiatric:        Speech: Speech normal.        Behavior: Behavior normal. Behavior is cooperative.        Thought Content: Thought content normal.        Judgment: Judgment normal.     Diabetic Labs (most recent): Lab Results  Component Value Date   HGBA1C 5.4 06/24/2019   HGBA1C 5.3 02/21/2019   Recent Results (from the past 2160 hour(s))  Novel Coronavirus, NAA ([facility])     Status: None   Collection Time: 05/11/19  3:07 PM   Specimen: Nasopharyngeal(NP) swabs in vial transport medium   NASOPHARYNGE  TESTING  Result Value Ref Range   SARS-CoV-2, NAA Not Detected Not Detected    Comment: This nucleic acid amplification test was developed and its performance characteristics determined by [employer]. Nucleic acid amplification tests include RT-PCR and TMA. This test has  not been FDA cleared or approved. This test has been authorized by FDA under an Emergency Use Authorization (EUA). This test is only authorized for the duration of time the declaration that circumstances exist justifying the authorization of the emergency use of in vitro diagnostic tests  for detection of SARS-CoV-2 virus and/or diagnosis of COVID-19 infection under section 564(b)(1) of the Act, 21 U.S.C. 263ZCH-8(I) (1), unless the authorization is terminated or revoked sooner. When diagnostic testing is negative, the possibility of a false negative result should be considered in the context of a patient's recent exposures and the presence of clinical signs and symptoms consistent with COVID-19. An individual without symptoms of COVID-19 and who is not shedding SARS-CoV-2 virus wo uld expect to have a negative (not detected) result in this assay.   SARS-COV-2, NAA 2 DAY TAT     Status: None   Collection Time: 05/11/19  3:07 PM   NASOPHARYNGE  TESTING  Result Value Ref Range   SARS-CoV-2, NAA 2 DAY TAT Performed   COMPLETE METABOLIC PANEL WITH GFR     Status: None   Collection Time: 06/17/19  9:38 AM  Result Value Ref Range   Glucose, Bld 94 65 - 99 mg/dL    Comment: .            Fasting reference interval .    BUN 16 7 - 25 mg/dL   Creat 0.82 0.60 - 1.35 mg/dL   GFR, Est Non African American 109 > OR = 60 mL/min/1.11m2   GFR, Est African American 126 > OR = 60 mL/min/1.39m2   BUN/Creatinine Ratio NOT APPLICABLE 6 - 22 (calc)   Sodium 139 135 - 146 mmol/L   Potassium 4.3 3.5 - 5.3 mmol/L   Chloride 107 98 - 110 mmol/L   CO2 26 20 - 32 mmol/L   Calcium 9.4 8.6 - 10.3 mg/dL   Total Protein 6.6 6.1 - 8.1 g/dL   Albumin 4.6 3.6 - 5.1 g/dL   Globulin 2.0 1.9 - 3.7 g/dL (calc)   AG Ratio 2.3 1.0 - 2.5 (calc)   Total Bilirubin 0.4 0.2 - 1.2 mg/dL   Alkaline phosphatase (APISO) 43 36 - 130 U/L   AST 24 10 - 40 U/L   ALT 42 9 - 46 U/L  Microalbumin / creatinine urine ratio     Status: None   Collection Time: 06/17/19  9:38 AM  Result Value Ref Range   Creatinine, Urine 85 20 - 320 mg/dL   Microalb, Ur <0.2 mg/dL    Comment: Reference Range Not established    Microalb Creat Ratio NOTE <30 mcg/mg creat    Comment: NOTE: The urine albumin value is less than   0.2 mg/dL therefore we are unable to calculate  excretion and/or creatinine ratio. . The ADA defines abnormalities in albumin excretion as follows: Marland Kitchen Category         Result (mcg/mg creatinine) . Normal                    <30 Microalbuminuria         30-299  Clinical albuminuria   > OR = 300 . The ADA recommends that at least two of three specimens collected within a 3-6 month period be abnormal before considering a patient to be within a diagnostic category.   Lipid panel     Status: Abnormal   Collection Time: 06/17/19  9:38 AM  Result Value Ref Range   Cholesterol 182 <200 mg/dL  HDL 44 > OR = 40 mg/dL   Triglycerides 116 <150 mg/dL   LDL Cholesterol (Calc) 116 (H) mg/dL (calc)    Comment: Reference range: <100 . Desirable range <100 mg/dL for primary prevention;   <70 mg/dL for patients with CHD or diabetic patients  with > or = 2 CHD risk factors. Marland Kitchen LDL-C is now calculated using the Martin-Hopkins  calculation, which is a validated novel method providing  better accuracy than the Friedewald equation in the  estimation of LDL-C.  Cresenciano Genre et al. Annamaria Helling. 8299;371(69): 2061-2068  (http://education.[facility].com/faq/FAQ164)    Total CHOL/HDL Ratio 4.1 <5.0 (calc)   Non-HDL Cholesterol (Calc) 138 (H) <130 mg/dL (calc)    Comment: For patients with diabetes plus 1 major ASCVD risk  factor, treating to a non-HDL-C goal of <100 mg/dL  (LDL-C of <70 mg/dL) is considered a therapeutic  option.   TSH     Status: None   Collection Time: 06/17/19  9:38 AM  Result Value Ref Range   TSH 1.50 0.40 - 4.50 mIU/L  T4, free     Status: None   Collection Time: 06/17/19  9:38 AM  Result Value Ref Range   Free T4 1.2 0.8 - 1.8 ng/dL  Testosterone Total,Free,Bio, Males     Status: None   Collection Time: 06/17/19  9:38 AM  Result Value Ref Range   Testosterone 398 250 - 827 ng/dL   Albumin 4.6 3.6 - 5.1 g/dL   Sex Hormone Binding 26 10 - 50 nmol/L   Testosterone, Free  62.4 46.0 - 224.0 pg/mL   Testosterone, Bioavailable 131.1 110.0 - 575 ng/dL  Prolactin     Status: None   Collection Time: 06/17/19  9:38 AM  Result Value Ref Range   Prolactin 2.9 2.0 - 18.0 ng/mL  PSA     Status: None   Collection Time: 06/17/19  9:38 AM  Result Value Ref Range   PSA 0.4 < OR = 4.0 ng/mL    Comment: The total PSA value from this assay system is  standardized against the WHO standard. The test  result will be approximately 20% lower when compared  to the equimolar-standardized total PSA (Beckman  Coulter). Comparison of serial PSA results should be  interpreted with this fact in mind. . This test was performed using the Siemens  chemiluminescent method. Values obtained from  different assay methods cannot be used interchangeably. PSA levels, regardless of value, should not be interpreted as absolute evidence of the presence or absence of disease.   CBC with Differential/Platelet     Status: None   Collection Time: 06/17/19  9:38 AM  Result Value Ref Range   WBC 6.8 3.8 - 10.8 Thousand/uL   RBC 5.52 4.20 - 5.80 Million/uL   Hemoglobin 15.8 13.2 - 17.1 g/dL   HCT 46.1 38 - 50 %   MCV 83.5 80.0 - 100.0 fL   MCH 28.6 27.0 - 33.0 pg   MCHC 34.3 32.0 - 36.0 g/dL   RDW 13.1 11.0 - 15.0 %   Platelets 244 140 - 400 Thousand/uL   MPV 9.3 7.5 - 12.5 fL   Neutro Abs 3,094 1,500 - 7,800 cells/uL   Lymphs Abs 2,672 850 - 3,900 cells/uL   Absolute Monocytes 626 200 - 950 cells/uL   Eosinophils Absolute 299 15 - 500 cells/uL   Basophils Absolute 109 0 - 200 cells/uL   Neutrophils Relative % 45.5 %   Total Lymphocyte 39.3 %   Monocytes Relative 9.2 %  Eosinophils Relative 4.4 %   Basophils Relative 1.6 %  Cortisol     Status: None   Collection Time: 06/17/19  9:38 AM  Result Value Ref Range   Cortisol, Plasma 7.5 mcg/dL    Comment: Reference Range: For 8 a.m.(7-9 a.m.) Specimen: 4.0-22.0 Reference Range: For 4 p.m.(3-5 p.m.) Specimen: 3.0-17.0   * Please  interpret above results accordingly * .   HgB A1c     Status: None   Collection Time: 06/24/19  9:20 AM  Result Value Ref Range   Hemoglobin A1C 5.4 4.0 - 5.6 %   HbA1c POC (<> result, manual entry)     HbA1c, POC (prediabetic range)     HbA1c, POC (controlled diabetic range)      Pituitary microadenoma 2018 MRI: 8 x 7 x 9 mm   Assessment & Plan:   1. Benign neoplasm of pituitary gland (HCC) -2. Diabetes mellitus without complication ([place]) 3. Mixed hyperlipidemia 4. Hypogonadism, male   1)  Regarding his pituitary microadenoma: His repeat endocrine work-up is still consistent with nonfunctional adenoma.  -He will not need pituitary MRI at this time.    2) currently controlled type 2 diabetes, point-of-care A1c of 5.4%.      Recent labs reviewed.  He denies hypoglycemia.  -He does not report any gross complications from his diabetes, however he remains at a high risk for more acute and chronic complications which include CAD, CVA, CKD, retinopathy, and neuropathy. These are all discussed in detail with him.  - I have counseled him on diet  and weight management  by adopting a carbohydrate restricted/protein rich diet. Patient is encouraged to switch to  unprocessed or minimally processed     complex starch and increased protein intake (animal or plant source), fruits, and vegetables. -  he is advised to stick to a routine mealtimes to eat 3 meals  a day and avoid unnecessary snacks ( to snack only to correct hypoglycemia).   - he  admits there is a room for improvement in his diet and drink choices. -  Suggestion is made for him to avoid simple carbohydrates  from his diet including Cakes, Sweet Desserts / Pastries, Ice Cream, Soda (diet and regular), Sweet Tea, Candies, Chips, Cookies, Sweet Pastries,  Store Bought Juices, Alcohol in Excess of  1-2 drinks a day, Artificial Sweeteners, Coffee Creamer, and "Sugar-free" Products. This will help patient to have stable blood glucose  profile and potentially avoid unintended weight gain.   - I have approached him with the following individualized plan to manage  his diabetes and patient agrees:   - he is advised to lower his Metformin to 500 mg extended release daily after breakfast.    - he will be considered for incretin therapy as appropriate next visit.  - Specific targets for  A1c;  LDL, HDL,  and Triglycerides were discussed with the patient.  3) Blood Pressure /Hypertension:  his blood pressure is  controlled to target.  He is not on any antihypertensive medications at this time.  4) Lipids/Hyperlipidemia: His recent lipid panel showed LDL high at 116.  He is advised to continue his TriCor 48 mg p.o. daily at bedtime, discussed and added Crestor 10 mg p.o. daily at bedtime as well.  Side effects and precautions discussed with him.   5)  Weight/Diet:  Body mass index is 43.37 kg/m.  -   clearly complicating his diabetes care.   he is  a candidate for weight loss. I discussed  with him the fact that loss of 5 - 10% of his  current body weight will have the most impact on his diabetes management.  Exercise, and detailed carbohydrates information provided  -  detailed on discharge instructions.  6) hypogonadism: I have reviewed his current and previous total testosterone measurements.  He did have testosterone as low as 66 in 2017.  Prior to his last visit, he did have fluctuating testosterone levels.  Most recent labs show total testosterone of 398 ng per DL, consistent with adequate replacement.   His genital exam is unremarkable including normal sized testicles.  He will be continued on the same dose of testosterone at this time 30 mg of testosterone gel daily with plan to measure total testosterone before his next visit.   Testosterone prescription was refilled for him.  He will have CBC along with his total testosterone next visit.  His PSA is normal.  7) Chronic Care/Health Maintenance:  -he  is on not ACEI/ARB nor   Statin medications and  is encouraged to initiate and continue to follow up with Ophthalmology, Dentist,  Podiatrist at least yearly or according to recommendations, and advised to   stay away from smoking. I have recommended yearly flu vaccine and pneumonia vaccine at least every 5 years; moderate intensity exercise for up to 150 minutes weekly; and  sleep for at least 7 hours a day.  - he is  advised to maintain close follow up with Patient, No Pcp Per for primary care needs, as well as his other providers for optimal and coordinated care.   - Time spent on this patient care encounter:  35 min, of which > 50% was spent in  counseling and the rest reviewing his blood glucose logs , discussing his hypoglycemia and hyperglycemia episodes, reviewing his current and  previous labs / studies  ( including abstraction from other facilities) and medications  doses and developing a  long term treatment plan and documenting his care.   Please refer to Patient Instructions for Blood Glucose Monitoring and Insulin/Medications Dosing Guide"  in media tab for additional information. Please  also refer to " Patient Self Inventory" in the Media  tab for reviewed elements of pertinent patient history.  Doreene Burke participated in the discussions, expressed understanding, and voiced agreement with the above plans.  All questions were answered to his satisfaction. he is encouraged to contact clinic should he have any questions or concerns prior to his return visit.    Follow up plan: - Return in about 4 months (around 10/24/2019) for Fasting Labs  in AM B4 8, F/U with Pre-visit Labs.  Glade Lloyd, MD [facility] [address] [place], [place] [address] Phone: [phone]  Fax: [phone]    06/24/2019, 1:42 PM  This note was partially dictated with voice recognition software. Similar sounding words can be transcribed inadequately or may not  be  corrected upon review.

## 2019-06-30 NOTE — Progress Notes (Signed)
Virtual Visit via Telephone Note  I connected with Aaron Hunt on 07/05/19 at  4:00 PM EDT by telephone and verified that I am speaking with the correct person using two identifiers.   I discussed the limitations, risks, security and privacy concerns of performing an evaluation and management service by telephone and the availability of in person appointments. I also discussed with the patient that there may be a patient responsible charge related to this service. The patient expressed understanding and agreed to proceed.    I discussed the assessment and treatment plan with the patient. The patient was provided an opportunity to ask questions and all were answered. The patient agreed with the plan and demonstrated an understanding of the instructions.   The patient was advised to call back or seek an in-person evaluation if the symptoms worsen or if the condition fails to improve as anticipated.  Location: patient- home, provider- home office   I provided 18 minutes of non-face-to-face time during this encounter.   [name], MD    [facility] MD/PA/NP OP Progress Note  07/05/2019 4:47 PM Aaron Hunt  MRN:  [id number]  Chief Complaint:  Chief Complaint    Follow-up; Trauma; Depression     HPI:  This is a follow up appointment for PTSD and depression.  He states that he has been doing better and feels more balanced. 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 Although he had tried to purchase land since last November, it was sold to somebody else. He feels anxious due to uncertainty. He currently rents a house from his family. He occasionally feels depressed as he feels disappointed that there is lack of discipline in family. He also states that his wife told him that he does not smile, although he is happy inside. He describes himself as "yelly person at nature," referring to his [employer] experience and upbringing. He tries to mitigate and is cognizant of his behavior.  He denies any aggression. He believes he is good on current medication regimen, and hopes to continue the same. He denies insomnia. He has good appetite. He has difficulty in concentration. He denies SI. He feels anxious, tense at times. He denies panic attacks. He denies nightmares. He has hypervigilance, flashback.      Visit Diagnosis:    ICD-10-CM   1. PTSD (post-traumatic stress disorder)  F43.10   2. MDD (major depressive disorder), recurrent episode, mild ([place])  F33.0     Past Psychiatric History: Please see initial evaluation for full details. I have reviewed the history. No updates at this time.     Past Medical History:  Past Medical History:  Diagnosis Date   Back pain    MRSA (methicillin resistant staph aureus) culture positive    Pituitary mass ([place])    Pneumonia     Past Surgical History:  Procedure Laterality Date   BACK SURGERY     EYE SURGERY Bilateral    finger surgery Right    littl;e finger x2   FOOT SURGERY Right    KNEE SURGERY Left     Family Psychiatric History: Please see initial evaluation for full details. I have reviewed the history. No updates at this time.     Family History:  Family History  Problem Relation Age of Onset   Cancer Mother    Diabetes Mother    Cancer Father    Depression Father    Drug abuse Father     Social History:  Social History  Socioeconomic History   Marital status: Married    Spouse name: Not on file   Number of children: Not on file   Years of education: Not on file   Highest education level: Not on file  Occupational History   Not on file  Tobacco Use   Smoking status: Former Smoker    Packs/day: 1.00    Years: 26.00    Pack years: 26.00    Types: Cigarettes    Quit date: 12/13/2014    Years since quitting: 4.5   Smokeless tobacco: Former Counsellor Use: Never used  Substance and Sexual Activity   Alcohol use: Not Currently    Comment: occasioanal    Drug use: Never   Sexual activity: Not on file  Other Topics Concern   Not on file  Social History Narrative   Not on file   Social Determinants of Health   Financial Resource Strain:    Difficulty of Paying Living Expenses:   Food Insecurity:    Worried About Charity fundraiser in the Last Year:    Arboriculturist in the Last Year:   Transportation Needs:    Film/video editor (Medical):    Lack of Transportation (Non-Medical):   Physical Activity:    Days of Exercise per Week:    Minutes of Exercise per Session:   Stress:    Feeling of Stress :   Social Connections:    Frequency of Communication with Friends and Family:    Frequency of Social Gatherings with Friends and Family:    Attends Religious Services:    Active Member of Clubs or Organizations:    Attends Archivist Meetings:    Marital Status:     Allergies: No Known Allergies  Metabolic Disorder Labs: Lab Results  Component Value Date   HGBA1C 5.4 06/24/2019   Lab Results  Component Value Date   PROLACTIN 2.9 06/17/2019   Lab Results  Component Value Date   CHOL 182 06/17/2019   TRIG 116 06/17/2019   HDL 44 06/17/2019   CHOLHDL 4.1 06/17/2019   LDLCALC 116 (H) 06/17/2019   Lab Results  Component Value Date   TSH 1.50 06/17/2019    Therapeutic Level Labs: No results found for: LITHIUM No results found for: VALPROATE No components found for:  CBMZ  Current Medications: Current Outpatient Medications  Medication Sig Dispense Refill   amphetamine-dextroamphetamine (ADDERALL) 20 MG tablet Take 1 tablet (20 mg total) by mouth 2 (two) times daily. 60 tablet 0   cetirizine (ZYRTEC) 10 MG tablet Take 1 tablet by mouth daily.     fluticasone (FLONASE) 50 MCG/ACT nasal spray Place 2 sprays into both nostrils daily as needed.      hydrOXYzine (ATARAX/VISTARIL) 50 MG tablet Take 50-100 mg by mouth at bedtime as needed.     ibuprofen (ADVIL) 800 MG tablet Take 1  tablet by mouth 2 (two) times daily as needed.     lamoTRIgine (LAMICTAL) 200 MG tablet Take 1 tablet (200 mg total) by mouth 2 (two) times daily. 60 tablet 1   metFORMIN (GLUCOPHAGE-XR) 500 MG 24 hr tablet Take 1 tablet (500 mg total) by mouth daily after breakfast. 90 tablet 1   rosuvastatin (CRESTOR) 10 MG tablet Take 1 tablet (10 mg total) by mouth daily. 90 tablet 1   Testosterone 10 MG/ACT (2%) GEL APPLY 3 PUMPS TO THE SKIN ONCE DAILY 60 g 0   TRICOR 48 MG tablet  Take 1 tablet by mouth daily.     No current facility-administered medications for this visit.     Musculoskeletal: Strength & Muscle Tone: N/A Gait & Station: N/A Patient leans: N/A  Psychiatric Specialty Exam: Review of Systems  Psychiatric/Behavioral: Positive for decreased concentration and dysphoric mood. Negative for agitation, behavioral problems, confusion, hallucinations, self-injury, sleep disturbance and suicidal ideas. The patient is nervous/anxious. The patient is not hyperactive.   All other systems reviewed and are negative.   There were no vitals taken for this visit.There is no height or weight on file to calculate BMI.  General Appearance: NA  Eye Contact:  NA  Speech:  Clear and Coherent  Volume:  Normal  Mood:  better  Affect:  NA  Thought Process:  Coherent  Orientation:  Full (Time, Place, and Person)  Thought Content: Logical   Suicidal Thoughts:  No  Homicidal Thoughts:  No  Memory:  Immediate;   Good  Judgement:  Good  Insight:  Fair  Psychomotor Activity:  Normal  Concentration:  Concentration: Good  Recall:  Good  Fund of Knowledge: Good  Language: Good  Akathisia:  No  Handed:  Right  AIMS (if indicated): not done  Assets:  Communication Skills Desire for Improvement  ADL's:  Intact  Cognition: WNL  Sleep:  Good   Screenings:   Assessment and Plan:  Gay Rape is a 44 y.o. year old male with a history of GAD, mood disorder, r/o PTSD, non functional pituitary  microadenoma, type 2 diabetes, hyperlipidemia, hypogonadism, who presents for follow up appointment for below.   1. MDD (major depressive disorder), recurrent episode, mild ([place]) 2. PTSD (post-traumatic stress disorder) Although he continue to report irritability, hypervigilance and occasional depressive symptoms, he has been managing things fairly well on his current medication regimen. Will continue lamotrigine for mood dysregulation given he reports good benefit from this medication since the initial visit. Discussed risk of stevens- johnson syndrome. Will consider adding antidepressant in the future if any worsening in his mood symptoms.    3. Attention deficit hyperactivity disorder (ADHD), unspecified ADHD type He has been on Adderall, prescribed by other provider. Will continue to evaluate.   Plan 1. Continue lamotrigine 200 mg twice a day 3. Next appointment-  in 1.5 month for 30 mins, video - obtain record from Dr. Simmie Davies - on Adderall 20 mg BID, prescribed by other provider  The patient demonstrates the following risk factors for suicide: Chronic risk factors for suicide include: psychiatric disorder of depression, PTSD. Acute risk factors for suicide include: N/A. Protective factors for this patient include: responsibility to others (children, family), coping skills and hope for the future. Considering these factors, the overall suicide risk at this point appears to be low. Patient is appropriate for outpatient follow up.    [name], MD 07/05/2019, 4:47 PM

## 2019-07-05 ENCOUNTER — Encounter (HOSPITAL_COMMUNITY): Payer: Self-pay | Admitting: Psychiatry

## 2019-07-05 ENCOUNTER — Telehealth (INDEPENDENT_AMBULATORY_CARE_PROVIDER_SITE_OTHER): Admitting: Psychiatry

## 2019-07-05 ENCOUNTER — Other Ambulatory Visit: Payer: Self-pay

## 2019-07-05 DIAGNOSIS — F330 Major depressive disorder, recurrent, mild: Secondary | ICD-10-CM | POA: Diagnosis not present

## 2019-07-05 DIAGNOSIS — F4310 Post-traumatic stress disorder, unspecified: Secondary | ICD-10-CM | POA: Diagnosis not present

## 2019-07-05 MED ORDER — LAMOTRIGINE 200 MG PO TABS: 200.0000 mg | ORAL_TABLET | Freq: Two times a day (BID) | ORAL | 1 refills | Status: DC

## 2019-08-17 NOTE — Progress Notes (Signed)
Virtual Visit via Video Note  I connected with Aaron Hunt on 08/23/19 at 10:40 AM EDT by a video enabled telemedicine application and verified that I am speaking with the correct person using two identifiers.   I discussed the limitations of evaluation and management by telemedicine and the availability of in person appointments. The patient expressed understanding and agreed to proceed.     I discussed the assessment and treatment plan with the patient. The patient was provided an opportunity to ask questions and all were answered. The patient agreed with the plan and demonstrated an understanding of the instructions.   The patient was advised to call back or seek an in-person evaluation if the symptoms worsen or if the condition fails to improve as anticipated.  Location: patient- home, provider- home office   I provided 13 minutes of non-face-to-face time during this encounter.   [name], MD    [facility] MD/PA/NP OP Progress Note  08/23/2019 11:49 AM Aaron Hunt  MRN:  [id number]  Chief Complaint:  Chief Complaint    Depression; Follow-up; ADHD     HPI:  This is a follow-up appointment for depression, PTSD and ADHD.  He states that he has been doing very well.  He enjoyed going to camp with his son and his friends, which she has not been able to do for a while.  When he is asked about his irritability, he states that it is constant work in progress.  He has been trying to work away from people as he knows triggers.  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  He is looking forward to attend to retirement ceremony for his coworker.  He reports great support from his coworkers.  He feels good that he was able to close the house.  He will work on medication, and states that it is good to have purpose.  He has occasional insomnia, which she attributes to racing thoughts.  He states that he is conditioned in the [employer] to  plan the day before.  He denies feeling depressed.  He has good appetite.  He denies any change in weight.  He denies anhedonia.  He denies SI.  He feels anxious and tense at times.  He denies panic attacks.  He has difficulty in concentration later in the day.  He needs to set a reminder because he is forgetful.  It is difficult for him to work on multi tasks later in the day.  He wanders if any adjustment of medication is needed.  He drinks a few mixed drink, a few times per week.  He denies craving for alcohol.  He denies drug use, although he is hoping that it is legalized to use CBD.   Daily routine: work,  Household: wife, two children Number of children: 2, age 64 and 31 Recently obtained bachelor degree.   Visit Diagnosis:    ICD-10-CM   1. MDD (major depressive disorder), recurrent, in partial remission ([place])  F33.41   2. PTSD (post-traumatic stress disorder)  F43.10   3. Attention deficit hyperactivity disorder (ADHD), unspecified ADHD type  F90.9     Past Psychiatric History: Please see initial evaluation for full details. I have reviewed the history. No updates at this time.     Past Medical History:  Past Medical History:  Diagnosis Date  . Back pain   . MRSA (methicillin resistant staph aureus) culture positive   . Pituitary mass ([place])   .  Pneumonia     Past Surgical History:  Procedure Laterality Date  . BACK SURGERY    . EYE SURGERY Bilateral   . finger surgery Right    littl;e finger x2  . FOOT SURGERY Right   . KNEE SURGERY Left     Family Psychiatric History: Please see initial evaluation for full details. I have reviewed the history. No updates at this time.     Family History:  Family History  Problem Relation Age of Onset  . Cancer Mother   . Diabetes Mother   . Cancer Father   . Depression Father   . Drug abuse Father     Social History:  Social History   Socioeconomic History  . Marital status: Married    Spouse name: Not on file  . Number  of children: Not on file  . Years of education: Not on file  . Highest education level: Not on file  Occupational History  . Not on file  Tobacco Use  . Smoking status: Former Smoker    Packs/day: 1.00    Years: 26.00    Pack years: 26.00    Types: Cigarettes    Quit date: 12/13/2014    Years since quitting: 4.6  . Smokeless tobacco: Former Network engineer  . Vaping Use: Never used  Substance and Sexual Activity  . Alcohol use: Not Currently    Comment: occasioanal  . Drug use: Never  . Sexual activity: Not on file  Other Topics Concern  . Not on file  Social History Narrative  . Not on file   Social Determinants of Health   Financial Resource Strain:   . Difficulty of Paying Living Expenses:   Food Insecurity:   . Worried About Charity fundraiser in the Last Year:   . Arboriculturist in the Last Year:   Transportation Needs:   . Film/video editor (Medical):   Marland Kitchen Lack of Transportation (Non-Medical):   Physical Activity:   . Days of Exercise per Week:   . Minutes of Exercise per Session:   Stress:   . Feeling of Stress :   Social Connections:   . Frequency of Communication with Friends and Family:   . Frequency of Social Gatherings with Friends and Family:   . Attends Religious Services:   . Active Member of Clubs or Organizations:   . Attends Archivist Meetings:   Marland Kitchen Marital Status:     Allergies: No Known Allergies  Metabolic Disorder Labs: Lab Results  Component Value Date   HGBA1C 5.4 06/24/2019   Lab Results  Component Value Date   PROLACTIN 2.9 06/17/2019   Lab Results  Component Value Date   CHOL 182 06/17/2019   TRIG 116 06/17/2019   HDL 44 06/17/2019   CHOLHDL 4.1 06/17/2019   LDLCALC 116 (H) 06/17/2019   Lab Results  Component Value Date   TSH 1.50 06/17/2019    Therapeutic Level Labs: No results found for: LITHIUM No results found for: VALPROATE No components found for:  CBMZ  Current Medications: Current  Outpatient Medications  Medication Sig Dispense Refill  . cetirizine (ZYRTEC) 10 MG tablet Take 1 tablet by mouth daily.    . fluticasone (FLONASE) 50 MCG/ACT nasal spray Place 2 sprays into both nostrils daily as needed.     . hydrOXYzine (ATARAX/VISTARIL) 50 MG tablet Take 50-100 mg by mouth at bedtime as needed.    Marland Kitchen ibuprofen (ADVIL) 800 MG tablet Take 1  tablet by mouth 2 (two) times daily as needed.    . lamoTRIgine (LAMICTAL) 150 MG tablet Take 1 tablet (150 mg total) by mouth 2 (two) times daily. 60 tablet 1  . metFORMIN (GLUCOPHAGE-XR) 500 MG 24 hr tablet Take 1 tablet (500 mg total) by mouth daily after breakfast. 90 tablet 1  . methylphenidate (CONCERTA) 27 MG PO CR tablet Take 1 tablet (27 mg total) by mouth every morning. 30 tablet 0  . [START ON 09/22/2019] methylphenidate (CONCERTA) 27 MG PO CR tablet Take 1 tablet (27 mg total) by mouth daily. 30 tablet 0  . rosuvastatin (CRESTOR) 10 MG tablet Take 1 tablet (10 mg total) by mouth daily. 90 tablet 1  . Testosterone 10 MG/ACT (2%) GEL APPLY 3 PUMPS TO THE SKIN ONCE DAILY 60 g 0  . TRICOR 48 MG tablet Take 1 tablet by mouth daily.     No current facility-administered medications for this visit.     Musculoskeletal: Strength & Muscle Tone: N/A Gait & Station: N/A Patient leans: N/A  Psychiatric Specialty Exam: Review of Systems  Psychiatric/Behavioral: Positive for decreased concentration. Negative for agitation, behavioral problems, confusion, dysphoric mood, hallucinations, self-injury, sleep disturbance and suicidal ideas. The patient is nervous/anxious. The patient is not hyperactive.   All other systems reviewed and are negative.   There were no vitals taken for this visit.There is no height or weight on file to calculate BMI.  General Appearance: Fairly Groomed  Eye Contact:  Good  Speech:  Clear and Coherent  Volume:  Normal  Mood:  good  Affect:  Appropriate, Congruent and euthymic  Thought Process:  Coherent   Orientation:  Full (Time, Place, and Person)  Thought Content: Logical   Suicidal Thoughts:  No  Homicidal Thoughts:  No  Memory:  Immediate;   Good  Judgement:  Good  Insight:  Fair  Psychomotor Activity:  Normal  Concentration:  Concentration: Good and Attention Span: Good  Recall:  Good  Fund of Knowledge: Good  Language: Good  Akathisia:  No  Handed:  Right  AIMS (if indicated): not done  Assets:  Communication Skills Desire for Improvement  ADL's:  Intact  Cognition: WNL  Sleep:  Poor   Screenings:   Assessment and Plan:  Aitan Rossbach is a 44 y.o. year old male with a history of  GAD, mood disorder, r/o PTSD, non functionalpituitary microadenoma,type 2 diabetes, hyperlipidemia, hypogonadism, who presents for follow up appointment for below.    1. MDD (major depressive disorder), recurrent, in partial remission ([place]) 2. PTSD (post-traumatic stress disorder) He denies significant mood symptoms since the last visit, and has been able to handle irritability fairly well. Will taper down lamotrigine for mood dysregulation given he has been on higher dose. Discussed potential risk of stevens johnson syndrome. Will consider adding antidepressant if any worsening in his mood symptoms.   3. Attention deficit hyperactivity disorder (ADHD), unspecified ADHD type He reports worsening in inattention later in the day. He is amenable to try long acting stimulant. Will try Concerta to target ADHD. Discussed potential risk, which includes but no limited to tachycardia, worsening in anxiety.   Plan 1. Decrease lamotrigine 150 mg twice a day 2. Discontinue Adderall 3. Start Concerta 27 mg daily 3. Next appointment- 9/28 at 8:30 for 20 mins,  video  The patient demonstrates the following risk factors for suicide: Chronic risk factors for suicide include:psychiatric disorder ofdepression, PTSD. Acute risk factorsfor suicide include:N/A. Protective factorsfor this patient  include: responsibility to  others (children, family), coping skills and hope for the future. Considering these factors, the overall suicide risk at this point appears to below. Patientisappropriate for outpatient follow up.  [name], MD 08/23/2019, 11:49 AM

## 2019-08-23 ENCOUNTER — Encounter (HOSPITAL_COMMUNITY): Payer: Self-pay | Admitting: Psychiatry

## 2019-08-23 ENCOUNTER — Other Ambulatory Visit: Payer: Self-pay

## 2019-08-23 ENCOUNTER — Telehealth (INDEPENDENT_AMBULATORY_CARE_PROVIDER_SITE_OTHER): Admitting: Psychiatry

## 2019-08-23 DIAGNOSIS — F3341 Major depressive disorder, recurrent, in partial remission: Secondary | ICD-10-CM | POA: Diagnosis not present

## 2019-08-23 DIAGNOSIS — F4310 Post-traumatic stress disorder, unspecified: Secondary | ICD-10-CM | POA: Diagnosis not present

## 2019-08-23 DIAGNOSIS — F909 Attention-deficit hyperactivity disorder, unspecified type: Secondary | ICD-10-CM | POA: Diagnosis not present

## 2019-08-23 MED ORDER — METHYLPHENIDATE HCL ER (OSM) 27 MG PO TBCR
27.0000 mg | EXTENDED_RELEASE_TABLET | Freq: Every day | ORAL | 0 refills | Status: DC
Start: 2019-09-22 — End: 2019-10-04

## 2019-08-23 MED ORDER — METHYLPHENIDATE HCL ER (OSM) 27 MG PO TBCR
27.0000 mg | EXTENDED_RELEASE_TABLET | ORAL | 0 refills | Status: DC
Start: 2019-08-23 — End: 2019-10-04

## 2019-08-23 MED ORDER — LAMOTRIGINE 150 MG PO TABS: 150.0000 mg | ORAL_TABLET | Freq: Two times a day (BID) | ORAL | 1 refills | Status: DC

## 2019-08-23 NOTE — Patient Instructions (Signed)
1. Decrease lamotrigine 150 mg twice a day 2. Discontinue Adderall 3. Start Concerta 27 mg daily 4. Next appointment- 9/28 at 8:30

## 2019-09-19 ENCOUNTER — Other Ambulatory Visit: Payer: Self-pay

## 2019-09-19 MED ORDER — TESTOSTERONE 10 MG/ACT (2%) TD GEL: TRANSDERMAL | 0 refills | Status: DC

## 2019-09-21 NOTE — Progress Notes (Signed)
Virtual Visit via Video Note  I connected with Aaron Hunt on 10/04/19 at  8:30 AM EDT by a video enabled telemedicine application and verified that I am speaking with the correct person using two identifiers.   I discussed the limitations of evaluation and management by telemedicine and the availability of in person appointments. The patient expressed understanding and agreed to proceed.   I discussed the assessment and treatment plan with the patient. The patient was provided an opportunity to ask questions and all were answered. The patient agreed with the plan and demonstrated an understanding of the instructions.   The patient was advised to call back or seek an in-person evaluation if the symptoms worsen or if the condition fails to improve as anticipated.  Location: patient- home, provider- home office   I provided 15 minutes of non-face-to-face time during this encounter.   [name], MD     [facility] MD/PA/NP OP Progress Note  10/04/2019 8:55 AM Aaron Hunt  MRN:  [id number]  Chief Complaint:  Chief Complaint    Follow-up; Depression; ADHD     HPI:  This is a follow-up appointment for depression, PTSD and ADHD.   He states that he has been doing very well.  He states that the combination of the current medication works well for him.  His family comments the similar way according to the patient.  he is working on building a new house. He noticed that he was more irritable and angry when he missed to take lamotrigine.  He was able to calm himself by leaving the situation when he got angry.  He denies any aggressive behaviors.  Although he states that he always appears sad even in the pictures for wedding, he denies feeling sad.  He has occasional middle insomnia.  He has good energy and motivation.  He has good appetite.  He enjoys going camping.  He denies any change in weight.  He denies SI, referring to the family of his acquaintances who committed suicide.  He feels  jittery at times, although it has improved since he reduced coffee intake in the afternoon.   ADHD-he states that he noticed significant difference since starting Concerta.  He is not forgetful, and has been able to focus better. He is able to do multi tasking and sustain tasks when he is on Concerta. He notices that he tends to be more distracted, and his mind starts wandering around 2-3 pm.  He wonders if he can try higher dose, although he also states that he feels comfortable staying on the current dose if that is necessary. He stopped drinking alcohol since he recognized that it affects his sleep. He denies drug use.    Daily routine: work, works on garden, takes care of his chicken, enjoys fishing on weekend,  Employment: works M-Fri for [employer] since 2016. Medically retired from [employer] 2016 due to back injury, and pituitary adenoma, served 18 years 6 months, deployed to [place] a few times,  Household: wife, 56 and 66 year old children Marital status: married since 2009, divorced in 2004 Number of children: 53.  49,  33, 69 year old in [place] Education- Production assistant, radio. He states that his father was alcoholic, and the patient was "never good enough." He had estranged relationship with him, although he is trying again after he has children. He is cognizant of issues around alcohol, and tries to be a better person for his children. He has great relationship with his mother, who deceased  in 2008.    Visit Diagnosis:    ICD-10-CM   1. MDD (major depressive disorder), recurrent, in partial remission ([place])  F33.41   2. PTSD (post-traumatic stress disorder)  F43.10   3. Attention deficit hyperactivity disorder (ADHD), unspecified ADHD type  F90.9 Drugs of abuse screen w/o alc (for BH OP)    Past Psychiatric History: Please see initial evaluation for full details. I have reviewed the history. No updates at this time.     Past Medical History:  Past Medical History:  Diagnosis  Date  . Back pain   . MRSA (methicillin resistant staph aureus) culture positive   . Pituitary mass ([place])   . Pneumonia     Past Surgical History:  Procedure Laterality Date  . BACK SURGERY    . EYE SURGERY Bilateral   . finger surgery Right    littl;e finger x2  . FOOT SURGERY Right   . KNEE SURGERY Left     Family Psychiatric History: Please see initial evaluation for full details. I have reviewed the history. No updates at this time.     Family History:  Family History  Problem Relation Age of Onset  . Cancer Mother   . Diabetes Mother   . Cancer Father   . Depression Father   . Drug abuse Father     Social History:  Social History   Socioeconomic History  . Marital status: Married    Spouse name: Not on file  . Number of children: Not on file  . Years of education: Not on file  . Highest education level: Not on file  Occupational History  . Not on file  Tobacco Use  . Smoking status: Former Smoker    Packs/day: 1.00    Years: 26.00    Pack years: 26.00    Types: Cigarettes    Quit date: 12/13/2014    Years since quitting: 4.8  . Smokeless tobacco: Former Network engineer  . Vaping Use: Never used  Substance and Sexual Activity  . Alcohol use: Not Currently    Comment: occasioanal  . Drug use: Never  . Sexual activity: Not on file  Other Topics Concern  . Not on file  Social History Narrative  . Not on file   Social Determinants of Health   Financial Resource Strain:   . Difficulty of Paying Living Expenses: Not on file  Food Insecurity:   . Worried About Charity fundraiser in the Last Year: Not on file  . Ran Out of Food in the Last Year: Not on file  Transportation Needs:   . Lack of Transportation (Medical): Not on file  . Lack of Transportation (Non-Medical): Not on file  Physical Activity:   . Days of Exercise per Week: Not on file  . Minutes of Exercise per Session: Not on file  Stress:   . Feeling of Stress : Not on file  Social  Connections:   . Frequency of Communication with Friends and Family: Not on file  . Frequency of Social Gatherings with Friends and Family: Not on file  . Attends Religious Services: Not on file  . Active Member of Clubs or Organizations: Not on file  . Attends Archivist Meetings: Not on file  . Marital Status: Not on file    Allergies: No Known Allergies  Metabolic Disorder Labs: Lab Results  Component Value Date   HGBA1C 5.4 06/24/2019   Lab Results  Component Value Date   PROLACTIN  2.9 06/17/2019   Lab Results  Component Value Date   CHOL 182 06/17/2019   TRIG 116 06/17/2019   HDL 44 06/17/2019   CHOLHDL 4.1 06/17/2019   LDLCALC 116 (H) 06/17/2019   Lab Results  Component Value Date   TSH 1.50 06/17/2019    Therapeutic Level Labs: No results found for: LITHIUM No results found for: VALPROATE No components found for:  CBMZ  Current Medications: Current Outpatient Medications  Medication Sig Dispense Refill  . cetirizine (ZYRTEC) 10 MG tablet Take 1 tablet by mouth daily.    . fluticasone (FLONASE) 50 MCG/ACT nasal spray Place 2 sprays into both nostrils daily as needed.     . hydrOXYzine (ATARAX/VISTARIL) 50 MG tablet Take 50-100 mg by mouth at bedtime as needed.    Marland Kitchen ibuprofen (ADVIL) 800 MG tablet Take 1 tablet by mouth 2 (two) times daily as needed.    . lamoTRIgine (LAMICTAL) 100 MG tablet Take 1 tablet (100 mg total) by mouth 2 (two) times daily. 60 tablet 0  . metFORMIN (GLUCOPHAGE-XR) 500 MG 24 hr tablet Take 1 tablet (500 mg total) by mouth daily after breakfast. 90 tablet 1  . methylphenidate 36 MG PO CR tablet Take 1 tablet (36 mg total) by mouth daily. 30 tablet 0  . rosuvastatin (CRESTOR) 10 MG tablet Take 1 tablet (10 mg total) by mouth daily. 90 tablet 1  . Testosterone 10 MG/ACT (2%) GEL APPLY 3 PUMPS TO THE SKIN ONCE DAILY 60 g 0  . TRICOR 48 MG tablet Take 1 tablet by mouth daily.     No current facility-administered medications for  this visit.     Musculoskeletal: Strength & Muscle Tone: N/A Gait & Station: N/A Patient leans: N/A  Psychiatric Specialty Exam: Review of Systems  Psychiatric/Behavioral: Negative for agitation, behavioral problems, confusion, decreased concentration, dysphoric mood, hallucinations, self-injury, sleep disturbance and suicidal ideas. The patient is nervous/anxious. The patient is not hyperactive.   All other systems reviewed and are negative.   There were no vitals taken for this visit.There is no height or weight on file to calculate BMI.  General Appearance: Fairly Groomed  Eye Contact:  Good  Speech:  Clear and Coherent  Volume:  Normal  Mood:  good  Affect:  Appropriate, Congruent and euthyic  Thought Process:  Coherent  Orientation:  Full (Time, Place, and Person)  Thought Content: Logical   Suicidal Thoughts:  No  Homicidal Thoughts:  No  Memory:  Immediate;   Good  Judgement:  Good  Insight:  Fair  Psychomotor Activity:  Normal  Concentration:  Concentration: Good and Attention Span: Good  Recall:  Good  Fund of Knowledge: Good  Language: Good  Akathisia:  No  Handed:  Right  AIMS (if indicated): not done  Assets:  Communication Skills Desire for Improvement  ADL's:  Intact  Cognition: WNL  Sleep:  Good   Screenings:   Assessment and Plan:  Aaron Hunt is a 44 y.o. year old male with a history of GAD, mood disorder, r/o PTSD, non functionalpituitary microadenoma,type 2 diabetes, hyperlipidemia, hypogonadism,, who presents for follow up appointment for below.   1. MDD (major depressive disorder), recurrent, in partial remission ([place]) 2. PTSD (post-traumatic stress disorder) He reports significant worsening in irritability when he accidentally missed to take Lamictal.  Will taper down lamotrigine for mood dysregulation.  Discussed potential risk of Stevens-Johnson syndrome.  Will consider adding antidepressant if any worsening in his mood symptoms.    3. Attention deficit  hyperactivity disorder (ADHD), unspecified ADHD type He has had significant benefit from Concerta since switching from Adderall.  Will uptitrate the dose to optimize treatment for ADHD.  Discussed potential risk, which includes but not limited to tachycardia, worsening in anxiety. Will obtain UDS.   Plan 1. Decrease lamotrigine 100 mg twice a day 2. Increase Concerta 27 mg daily 3. Next appointment- 10/26 at 8:30 for 30 mins,  video 4. Obtain UDS   I have utilized the [place] Controlled Substances Reporting System (PMP AWARxE) to confirm adherence regarding the patient's medication. My review reveals appropriate prescription fills.   The patient demonstrates the following risk factors for suicide: Chronic risk factors for suicide include:psychiatric disorder ofdepression, PTSD. Acute risk factorsfor suicide include:N/A. Protective factorsfor this patient include: responsibility to others (children, family), coping skills and hope for the future. Considering these factors, the overall suicide risk at this point appears to below. Patientisappropriate for outpatient follow up.   [name], MD 10/04/2019, 8:55 AM

## 2019-10-04 ENCOUNTER — Telehealth (INDEPENDENT_AMBULATORY_CARE_PROVIDER_SITE_OTHER): Admitting: Psychiatry

## 2019-10-04 ENCOUNTER — Other Ambulatory Visit: Payer: Self-pay

## 2019-10-04 ENCOUNTER — Encounter (HOSPITAL_COMMUNITY): Payer: Self-pay | Admitting: Psychiatry

## 2019-10-04 DIAGNOSIS — F4310 Post-traumatic stress disorder, unspecified: Secondary | ICD-10-CM

## 2019-10-04 DIAGNOSIS — F909 Attention-deficit hyperactivity disorder, unspecified type: Secondary | ICD-10-CM

## 2019-10-04 DIAGNOSIS — F3341 Major depressive disorder, recurrent, in partial remission: Secondary | ICD-10-CM | POA: Diagnosis not present

## 2019-10-04 MED ORDER — METHYLPHENIDATE HCL ER (OSM) 36 MG PO TBCR: 36.0000 mg | EXTENDED_RELEASE_TABLET | Freq: Every day | ORAL | 0 refills | Status: DC

## 2019-10-04 MED ORDER — LAMOTRIGINE 100 MG PO TABS: 100.0000 mg | ORAL_TABLET | Freq: Two times a day (BID) | ORAL | 0 refills | Status: DC

## 2019-10-04 NOTE — Patient Instructions (Signed)
1. Decrease lamotrigine 100 mg twice a day 2. Increase Concerta 27 mg daily 3. Next appointment- 10/26 at 8:30 4. Obtain UDS

## 2019-10-13 LAB — TESTOSTERONE TOTAL,FREE,BIO, MALES
Albumin: 4.6 g/dL (ref 3.6–5.1)
Sex Hormone Binding: 24 nmol/L (ref 10–50)
Testosterone, Bioavailable: 108.2 ng/dL — ABNORMAL LOW (ref >=110.0)
Testosterone, Free: 51.5 pg/mL (ref 46.0–224.0)
Testosterone: 318 ng/dL (ref 250–827)

## 2019-10-13 LAB — CBC WITH DIFFERENTIAL/PLATELET
Absolute Monocytes: 757 cells/uL (ref 200–950)
Basophils Absolute: 128 cells/uL (ref 0–200)
Basophils Relative: 1.5 %
Eosinophils Absolute: 264 cells/uL (ref 15–500)
Eosinophils Relative: 3.1 %
HCT: 47.4 % (ref 38.5–50.0)
Hemoglobin: 16.1 g/dL (ref 13.2–17.1)
Lymphs Abs: 2542 cells/uL (ref 850–3900)
MCH: 29.1 pg (ref 27.0–33.0)
MCHC: 34 g/dL (ref 32.0–36.0)
MCV: 85.7 fL (ref 80.0–100.0)
MPV: 9.7 fL (ref 7.5–12.5)
Monocytes Relative: 8.9 %
Neutro Abs: 4811 cells/uL (ref 1500–7800)
Neutrophils Relative %: 56.6 %
Platelets: 267 10*3/uL (ref 140–400)
RBC: 5.53 10*6/uL (ref 4.20–5.80)
RDW: 13 % (ref 11.0–15.0)
Total Lymphocyte: 29.9 %
WBC: 8.5 10*3/uL (ref 3.8–10.8)

## 2019-10-13 LAB — DRUG MONITOR, PANEL 1, SCREEN, URINE
Amphetamines: NEGATIVE ng/mL (ref <500)
Barbiturates: NEGATIVE ng/mL (ref <300)
Benzodiazepines: NEGATIVE ng/mL (ref <100)
Cocaine Metabolite: NEGATIVE ng/mL (ref <150)
Creatinine: 34.3 mg/dL
Marijuana Metabolite: POSITIVE ng/mL — AB (ref <20)
Methadone Metabolite: NEGATIVE ng/mL (ref <100)
Opiates: NEGATIVE ng/mL (ref <100)
Oxidant: NEGATIVE ug/mL
Oxycodone: NEGATIVE ng/mL (ref <100)
Phencyclidine: NEGATIVE ng/mL (ref <25)
pH: 6.5 (ref 4.5–9.0)

## 2019-10-13 LAB — PSA: PSA: 0.56 ng/mL (ref <=4.0)

## 2019-10-13 LAB — DM TEMPLATE

## 2019-10-14 ENCOUNTER — Ambulatory Visit (INDEPENDENT_AMBULATORY_CARE_PROVIDER_SITE_OTHER): Admitting: "Endocrinology

## 2019-10-14 ENCOUNTER — Encounter: Payer: Self-pay | Admitting: "Endocrinology

## 2019-10-14 ENCOUNTER — Other Ambulatory Visit: Payer: Self-pay

## 2019-10-14 VITALS — BP 151/86 | HR 91 | Ht 66.5 in | Wt 273.0 lb

## 2019-10-14 DIAGNOSIS — E291 Testicular hypofunction: Secondary | ICD-10-CM

## 2019-10-14 DIAGNOSIS — E782 Mixed hyperlipidemia: Secondary | ICD-10-CM | POA: Diagnosis not present

## 2019-10-14 DIAGNOSIS — D352 Benign neoplasm of pituitary gland: Secondary | ICD-10-CM | POA: Diagnosis not present

## 2019-10-14 DIAGNOSIS — E1159 Type 2 diabetes mellitus with other circulatory complications: Secondary | ICD-10-CM | POA: Diagnosis not present

## 2019-10-14 LAB — HEMOGLOBIN A1C: Hemoglobin A1C: 5.3

## 2019-10-14 NOTE — Progress Notes (Signed)
10/14/2019, 9:50 AM  Endocrinology follow-up note  Subjective:    Patient ID: Aaron Hunt, male    DOB: 03-04-1975.  Aaron Hunt is being seen in follow-up after he was seen in consultation for history of pituitary microadenoma, management of currently controlled asymptomatic type 2 diabetes, hyperlipidemia, hypogonadism. PCP : Monico Blitz   Past Medical History:  Diagnosis Date  . Back pain   . MRSA (methicillin resistant staph aureus) culture positive   . Pituitary mass ([place])   . Pneumonia     Past Surgical History:  Procedure Laterality Date  . BACK SURGERY    . EYE SURGERY Bilateral   . finger surgery Right    littl;e finger x2  . FOOT SURGERY Right   . KNEE SURGERY Left     Social History   Socioeconomic History  . Marital status: Married    Spouse name: Not on file  . Number of children: Not on file  . Years of education: Not on file  . Highest education level: Not on file  Occupational History  . Not on file  Tobacco Use  . Smoking status: Former Smoker    Packs/day: 1.00    Years: 26.00    Pack years: 26.00    Types: Cigarettes    Quit date: 12/13/2014    Years since quitting: 4.8  . Smokeless tobacco: Former Network engineer  . Vaping Use: Never used  Substance and Sexual Activity  . Alcohol use: Not Currently    Comment: occasioanal  . Drug use: Never  . Sexual activity: Not on file  Other Topics Concern  . Not on file  Social History Narrative  . Not on file   Social Determinants of Health   Financial Resource Strain:   . Difficulty of Paying Living Expenses: Not on file  Food Insecurity:   . Worried About Charity fundraiser in the Last Year: Not on file  . Ran Out of Food in the Last Year: Not on file  Transportation Needs:   . Lack of Transportation (Medical): Not on file  . Lack of Transportation (Non-Medical): Not on file  Physical Activity:    . Days of Exercise per Week: Not on file  . Minutes of Exercise per Session: Not on file  Stress:   . Feeling of Stress : Not on file  Social Connections:   . Frequency of Communication with Friends and Family: Not on file  . Frequency of Social Gatherings with Friends and Family: Not on file  . Attends Religious Services: Not on file  . Active Member of Clubs or Organizations: Not on file  . Attends Archivist Meetings: Not on file  . Marital Status: Not on file    Family History  Problem Relation Age of Onset  . Cancer Mother   . Diabetes Mother   . Cancer Father   . Depression Father   . Drug abuse Father     Outpatient Encounter Medications as of 10/14/2019  Medication Sig  . cetirizine (ZYRTEC) 10 MG tablet Take 1 tablet by mouth daily.  . fluticasone (FLONASE) 50  MCG/ACT nasal spray Place 2 sprays into both nostrils daily as needed.   . hydrOXYzine (ATARAX/VISTARIL) 50 MG tablet Take 50-100 mg by mouth at bedtime as needed.  Marland Kitchen ibuprofen (ADVIL) 800 MG tablet Take 1 tablet by mouth 2 (two) times daily as needed.  . lamoTRIgine (LAMICTAL) 100 MG tablet Take 1 tablet (100 mg total) by mouth 2 (two) times daily.  . metFORMIN (GLUCOPHAGE-XR) 500 MG 24 hr tablet Take 1 tablet (500 mg total) by mouth daily after breakfast.  . methylphenidate 36 MG PO CR tablet Take 1 tablet (36 mg total) by mouth daily.  . rosuvastatin (CRESTOR) 10 MG tablet Take 1 tablet (10 mg total) by mouth daily.  . Testosterone 10 MG/ACT (2%) GEL APPLY 3 PUMPS TO THE SKIN ONCE DAILY  . TRICOR 48 MG tablet Take 1 tablet by mouth daily.   No facility-administered encounter medications on file as of 10/14/2019.    ALLERGIES: No Known Allergies  VACCINATION STATUS:  There is no immunization history on file for this patient.  Diabetes He presents for his follow-up diabetic visit. He has type 2 diabetes mellitus. His disease course has been stable. There are no hypoglycemic associated symptoms.  Pertinent negatives for hypoglycemia include no confusion, headaches, pallor or seizures. There are no diabetic associated symptoms. Pertinent negatives for diabetes include no chest pain, no fatigue, no polydipsia, no polyphagia, no polyuria and no weakness. There are no hypoglycemic complications. Symptoms are stable. Diabetic complications include PVD. Risk factors for coronary artery disease include diabetes mellitus, dyslipidemia, male sex, obesity, tobacco exposure and sedentary lifestyle. Current diabetic treatment includes oral agent (monotherapy). He is compliant with treatment most of the time. His weight is stable. He is following a generally unhealthy diet. When asked about meal planning, he reported none. He participates in exercise intermittently. There is no change in his home blood glucose trend. (His point-of-care A1c is 5.4% consistent with well-controlled diabetes type 2.   ) An ACE inhibitor/angiotensin II receptor blocker is not being taken.  Hyperlipidemia This is a chronic problem. The current episode started more than 1 year ago. Associated symptoms include myalgias. Pertinent negatives include no chest pain or shortness of breath. Current antihyperlipidemic treatment includes fibric acid derivatives and statins. Risk factors for coronary artery disease include dyslipidemia, diabetes mellitus, male sex, obesity, a sedentary lifestyle and family history.   Hypogonadism: He was diagnosed with hypogonadism at approximate age of 83, has been on testosterone supplement intermittently for the last 5 to 6 years.  His most recent testosterone level is 318 after skipping his topical testosterone supplement the morning of the blood draw.    Pituitary microadenoma: First discovered in 2016 at which time was measuring 8 x 8 x 10 mm-a total of 1 of 640 mm3,   complete endocrine evaluation was negative.  Repeat MRI in 2028 showed measurements dropping to 8 x 7 x 9 mm-total volume of 504  mm3.   Review of Systems  Constitutional: Negative for chills, fatigue, fever and unexpected weight change.  HENT: Negative for dental problem, mouth sores and trouble swallowing.   Eyes: Negative for visual disturbance.  Respiratory: Negative for cough, choking, chest tightness, shortness of breath and wheezing.   Cardiovascular: Negative for chest pain, palpitations and leg swelling.  Gastrointestinal: Negative for abdominal distention, abdominal pain, constipation, diarrhea, nausea and vomiting.  Endocrine: Negative for polydipsia, polyphagia and polyuria.  Genitourinary: Negative for dysuria, flank pain, hematuria and urgency.  Musculoskeletal: Positive for myalgias. Negative for back  pain, gait problem and neck pain.  Skin: Negative for pallor, rash and wound.  Neurological: Negative for seizures, syncope, weakness, numbness and headaches.  Psychiatric/Behavioral: Negative for confusion and dysphoric mood.    Objective:    Vitals with BMI 10/14/2019 06/24/2019 03/18/2019  Height 5' 6.5" 5' 6.5" 5' 6.5"  Weight 273 lbs 272 lbs 13 oz 279 lbs  BMI 43.41 64.33 29.51  Systolic 884 166 063  Diastolic 86 83 78  Pulse 91 80 83    BP (!) 151/86   Pulse 91   Ht 5' 6.5" (1.689 m)   Wt 273 lb (123.8 kg)   BMI 43.40 kg/m   Wt Readings from Last 3 Encounters:  10/14/19 273 lb (123.8 kg)  06/24/19 272 lb 12.8 oz (123.7 kg)  03/18/19 279 lb (126.6 kg)     Physical Exam Constitutional:      General: He is not in acute distress.    Appearance: He is well-developed.  HENT:     Head: Normocephalic and atraumatic.  Neck:     Thyroid: No thyromegaly.     Trachea: No tracheal deviation.  Cardiovascular:     Rate and Rhythm: Normal rate.     Pulses:          Dorsalis pedis pulses are 1+ on the right side and 1+ on the left side.       Posterior tibial pulses are 1+ on the right side and 1+ on the left side.     Heart sounds: Normal heart sounds, S1 normal and S2 normal. No murmur  heard.  No gallop.   Pulmonary:     Effort: No respiratory distress.     Breath sounds: Normal breath sounds. No wheezing.  Abdominal:     General: Bowel sounds are normal. There is no distension.     Palpations: Abdomen is soft.     Tenderness: There is no abdominal tenderness. There is no guarding.  Musculoskeletal:     Right shoulder: No swelling or deformity.     Cervical back: Normal range of motion and neck supple.  Skin:    General: Skin is warm and dry.     Findings: No rash.     Nails: There is no clubbing.     Comments: Genital exam is unremarkable with testicles 25 cc bilaterally clinical exam is unremarkable instead of bursitis 25/CC bilaterally noscrotalmass.  No inguinal or femoral hernia.  Circumcised male.  Neurological:     Mental Status: He is alert and oriented to person, place, and time.     Cranial Nerves: No cranial nerve deficit.     Sensory: No sensory deficit.     Gait: Gait normal.     Deep Tendon Reflexes: Reflexes are normal and symmetric.  Psychiatric:        Speech: Speech normal.        Behavior: Behavior normal. Behavior is cooperative.        Thought Content: Thought content normal.        Judgment: Judgment normal.     Diabetic Labs (most recent): Lab Results  Component Value Date   HGBA1C 5.3 10/14/2019   HGBA1C 5.4 06/24/2019   HGBA1C 5.3 02/21/2019   Recent Results (from the past 2160 hour(s))  CBC with Differential/Platelet     Status: None   Collection Time: 10/12/19  7:03 AM  Result Value Ref Range   WBC 8.5 3.8 - 10.8 Thousand/uL   RBC 5.53 4.20 - 5.80 Million/uL  Hemoglobin 16.1 13.2 - 17.1 g/dL   HCT 47.4 38 - 50 %   MCV 85.7 80.0 - 100.0 fL   MCH 29.1 27.0 - 33.0 pg   MCHC 34.0 32.0 - 36.0 g/dL   RDW 13.0 11.0 - 15.0 %   Platelets 267 140 - 400 Thousand/uL   MPV 9.7 7.5 - 12.5 fL   Neutro Abs 4,811 1,500 - 7,800 cells/uL   Lymphs Abs 2,542 850 - 3,900 cells/uL   Absolute Monocytes 757 200 - 950 cells/uL   Eosinophils  Absolute 264 15 - 500 cells/uL   Basophils Absolute 128 0 - 200 cells/uL   Neutrophils Relative % 56.6 %   Total Lymphocyte 29.9 %   Monocytes Relative 8.9 %   Eosinophils Relative 3.1 %   Basophils Relative 1.5 %  PSA     Status: None   Collection Time: 10/12/19  7:03 AM  Result Value Ref Range   PSA 0.56 < OR = 4.0 ng/mL    Comment: The total PSA value from this assay system is  standardized against the WHO standard. The test  result will be approximately 20% lower when compared  to the equimolar-standardized total PSA (Beckman  Coulter). Comparison of serial PSA results should be  interpreted with this fact in mind. . This test was performed using the Siemens  chemiluminescent method. Values obtained from  different assay methods cannot be used interchangeably. PSA levels, regardless of value, should not be interpreted as absolute evidence of the presence or absence of disease.   Testosterone Total,Free,Bio, Males     Status: Abnormal   Collection Time: 10/12/19  7:03 AM  Result Value Ref Range   Testosterone 318 250 - 827 ng/dL   Albumin 4.6 3.6 - 5.1 g/dL   Sex Hormone Binding 24 10 - 50 nmol/L   Testosterone, Free 51.5 46.0 - 224.0 pg/mL   Testosterone, Bioavailable 108.2 (L) 110.0 - 575 ng/dL  DRUG MONITOR, PANEL 1, SCREEN, URINE     Status: Abnormal   Collection Time: 10/12/19  3:37 PM  Result Value Ref Range   Amphetamines NEGATIVE <500 ng/mL    Comment: See Note 1   Barbiturates NEGATIVE <300 ng/mL    Comment: See Note 1   Benzodiazepines NEGATIVE <100 ng/mL    Comment: See Note 1   Cocaine Metabolite NEGATIVE <150 ng/mL    Comment: See Note 1   Marijuana Metabolite POSITIVE (A) <20 ng/mL    Comment: See Note 1   Methadone Metabolite NEGATIVE <100 ng/mL    Comment: See Note 1   Opiates NEGATIVE <100 ng/mL    Comment: See Note 1   Oxycodone NEGATIVE <100 ng/mL    Comment: See Note 1   Phencyclidine NEGATIVE <25 ng/mL    Comment: See Note 1   Creatinine  34.3 mg/dL   pH 6.5 4.5 - 9.0   Oxidant NEGATIVE mcg/mL    Comment: Note 1 The results are presumptive; based only on screening methods, and they have not been confirmed by a definitive method.   DM TEMPLATE     Status: None   Collection Time: 10/12/19  3:37 PM  Result Value Ref Range   Notes and Comments      Comment: This drug testing is for medical treatment only. Analysis was performed as non-forensic testing and these results should be used only by healthcare providers to render diagnosis or treatment, or to monitor progress of medical conditions. . Note 1: The results are presumptive; based  only on screening methods, and they have not been confirmed by a definitive method. . . Healthcare Providers needing Interpretation assistance,  please contact us at [phone] ([phone])  M-F, 8am to 10pm EST   Hemoglobin A1c     Status: None   Collection Time: 10/14/19 12:00 AM  Result Value Ref Range   Hemoglobin A1C 5.3     Pituitary microadenoma 2018 MRI: 8 x 7 x 9 mm   Assessment & Plan:   1. Benign neoplasm of pituitary gland ([facility]) -2. Diabetes mellitus without complication ([place]) 3. Mixed hyperlipidemia 4. Hypogonadism, male   1)  Regarding his pituitary microadenoma: His repeat endocrine work-up is still consistent with nonfunctional adenoma.  -He will not need pituitary imaging studies at this time.     2) currently controlled type 2 diabetes, point-of-care A1c of 5.3%.        Recent labs reviewed.  He denies hypoglycemia.  -He is found to have bilateral PAD this morning on ABI in the clinic, and he remains at a high risk for more acute and chronic complications which include CAD, CVA, CKD, retinopathy, and neuropathy. These are all discussed in detail with him.  - I have counseled him on diet  and weight management  by adopting a carbohydrate restricted/protein rich diet. Patient is encouraged to switch to  unprocessed or minimally processed      complex starch and increased protein intake (animal or plant source), fruits, and vegetables. -  he is advised to stick to a routine mealtimes to eat 3 meals  a day and avoid unnecessary snacks ( to snack only to correct hypoglycemia).   - he  admits there is a room for improvement in his diet and drink choices. -  Suggestion is made for him to avoid simple carbohydrates  from his diet including Cakes, Sweet Desserts / Pastries, Ice Cream, Soda (diet and regular), Sweet Tea, Candies, Chips, Cookies, Sweet Pastries,  Store Bought Juices, Alcohol in Excess of  1-2 drinks a day, Artificial Sweeteners, Coffee Creamer, and "Sugar-free" Products. This will help patient to have stable blood glucose profile and potentially avoid unintended weight gain.  - I have approached him with the following individualized plan to manage  his diabetes and patient agrees:   - he is advised to continue Metformin 500 mg extended release daily after breakfast.   - he will be considered for incretin therapy as appropriate next visit.  - Specific targets for  A1c;  LDL, HDL,  and Triglycerides were discussed with the patient.  3) Blood Pressure /Hypertension:  his blood pressure was elevated this morning in the clinic.    He is not on antihypertensive medications.  This is the first time his blood pressure registered higher than target.  He may benefit from low-dose ACEI or hydrochlorothiazide on subsequent visits.    4) Lipids/Hyperlipidemia: His recent lipid panel showed LDL high at 116.  He was initiated on Crestor 10 mg p.o. nightly last visit in addition to his TriCor 48 mg p.o. daily.  He is advised to continue on these 2 medications.    Side effects and precautions discussed with him.  See below.   5)  Weight/Diet:  Body mass index is 43.4 kg/m.  -   clearly complicating his diabetes care.   he is  a candidate for weight loss. I discussed with him the fact that loss of 5 - 10% of his  current body weight will have  the most impact on his  diabetes management.  Exercise, and detailed carbohydrates information provided  -  detailed on discharge instructions.  6) hypogonadism: I have reviewed his current and previous total testosterone measurements.  He did have testosterone as low as 66 in 2017.  Prior to his last visit, he did have fluctuating testosterone levels.  Most recent labs show total testosterone of 318 ng per DL, consistent with adequate replacement.   His genital exam is unremarkable including normal sized testicles.  He will be continued on the same dose of testosterone at this time 30 mg of testosterone gel daily with plan to measure total testosterone before his next visit.     He will have total testosterone, PSA during his next visit.    7) Chronic Care/Health Maintenance:  -he  is on not ACEI/ARB nor  Statin medications and  is encouraged to initiate and continue to follow up with Ophthalmology, Dentist,  Podiatrist at least yearly or according to recommendations, and advised to   stay away from smoking. I have recommended yearly flu vaccine and pneumonia vaccine at least every 5 years; moderate intensity exercise for up to 150 minutes weekly; and  sleep for at least 7 hours a day.   POCT ABI Results 10/14/19  His ABI is abnormal. Right ABI: 0.98-borderline PAD      left ABI: 0.87  Right leg systolic / diastolic: 818/56 mmHg Left leg systolic / diastolic: 314/97 mmHg  Arm systolic / diastolic: 026/37 mmHG  This study is abnormal, due to his multiple risk factors he will benefit from early evaluation by vascular surgery.  I discussed and offered referral to vascular surgery.     - he is  advised to maintain close follow up with his PCP for primary care needs, as well as his other providers for optimal and coordinated care.   - Time spent on this patient care encounter:  35 min, of which > 50% was spent in  counseling and the rest reviewing his blood glucose logs , discussing his  hypoglycemia and hyperglycemia episodes, reviewing his current and  previous labs / studies  ( including abstraction from other facilities) and medications  doses and developing a  long term treatment plan and documenting his care.   Please refer to Patient Instructions for Blood Glucose Monitoring and Insulin/Medications Dosing Guide"  in media tab for additional information. Please  also refer to " Patient Self Inventory" in the Media  tab for reviewed elements of pertinent patient history.  Doreene Burke participated in the discussions, expressed understanding, and voiced agreement with the above plans.  All questions were answered to his satisfaction. he is encouraged to contact clinic should he have any questions or concerns prior to his return visit.   Follow up plan: - Return in about 4 months (around 02/14/2020) for F/U with Pre-visit Labs, Fasting Labs  in AM B4 8.  Glade Lloyd, MD [facility] [address] [place], [place] [address] Phone: [phone]  Fax: [phone]    10/14/2019, 9:50 AM  This note was partially dictated with voice recognition software. Similar sounding words can be transcribed inadequately or may not  be corrected upon review.

## 2019-10-14 NOTE — Patient Instructions (Signed)

## 2019-10-26 ENCOUNTER — Other Ambulatory Visit: Payer: Self-pay

## 2019-10-26 DIAGNOSIS — R6889 Other general symptoms and signs: Secondary | ICD-10-CM

## 2019-10-26 NOTE — Progress Notes (Addendum)
Virtual Visit via Video Note  I connected with Aaron Hunt on 11/01/19 at  8:30 AM EDT by a video enabled telemedicine application and verified that I am speaking with the correct person using two identifiers.  Location: Patient: home Provider: home office   I discussed the limitations of evaluation and management by telemedicine and the availability of in person appointments. The patient expressed understanding and agreed to proceed.    I discussed the assessment and treatment plan with the patient. The patient was provided an opportunity to ask questions and all were answered. The patient agreed with the plan and demonstrated an understanding of the instructions.   The patient was advised to call back or seek an in-person evaluation if the symptoms worsen or if the condition fails to improve as anticipated.  I provided 28 minutes of non-face-to-face time during this encounter.   [name], MD    [facility] MD/PA/NP OP Progress Note  11/01/2019 9:05 AM Aaron Hunt  MRN:  [id number]  Chief Complaint:  Chief Complaint    Follow-up; Depression; Trauma     HPI:  This is a follow-up appointment for depression, PTSD and inattention.  He states that he has been doing better.  He reports great relationship with his children and wife.  Although that the work is boring, he has been handling things well.  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  He has been attending meetings for social gathering.  He enjoys interacting with other people.  He states that he recognizes that he needs to be tender, although he had been in emotionless field for many years. He has been trying to be proactive rather than reactive.   He has occasional insomnia.  He denies feeling depressed.  He has good appetite.  He denies SI.  He feels less irritable.  He has hypervigilance. Upon verification of medication, he states that he has been taking lamotrigine 100 mg daily instead of 100 mg  BID.  Inattention-he notices that he has been able to stay on track longer since up titration of Concerta.  Although he still misplaces things, and has issues with organizational skills, he puts reminders, and handles things well.    Substance use- When he is asked about positive UDS/marijuana, he states that he did not disclose its use as he was concerned how it would affect the perception of providers. He uses marijuana for pain, almost every day. He is unsure if he is able to quit using it. He drank hard iced tea last night. He denies habitual alcohol use.   Daily routine:work, works on garden, takes care of his chicken, enjoys fishing on weekend, Employment:works M-Fri for [employer] since 05-Apr-2014. 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 86, 48, 45 year old in [place] Education- Production assistant, radio. He states that his father was alcoholic, and the patient was "never good enough." He had estranged relationship with him, although he is trying again after he has children. He is cognizant of issues around alcohol, and tries to be a better person for his children. He has great relationship with his mother, who deceased in 05-Apr-2006.    Visit Diagnosis:    ICD-10-CM   1. MDD (major depressive disorder), recurrent, in partial remission ([place])  F33.41   2. PTSD (post-traumatic stress disorder)  F43.10   3. Attention deficit hyperactivity disorder (ADHD), unspecified ADHD type  F90.9     Past Psychiatric History: Please see initial evaluation for full details. I have reviewed the history. No updates at this time.     Past Medical History:  Past Medical History:  Diagnosis Date  . Back pain   . MRSA (methicillin resistant staph aureus) culture  positive   . Pituitary mass ([place])   . Pneumonia     Past Surgical History:  Procedure Laterality Date  . BACK SURGERY    . EYE SURGERY Bilateral   . finger surgery Right    littl;e finger x2  . FOOT SURGERY Right   . KNEE SURGERY Left     Family Psychiatric History: Please see initial evaluation for full details. I have reviewed the history. No updates at this time.     Family History:  Family History  Problem Relation Age of Onset  . Cancer Mother   . Diabetes Mother   . Cancer Father   . Depression Father   . Drug abuse Father     Social History:  Social History   Socioeconomic History  . Marital status: Married    Spouse name: Not on file  . Number of children: Not on file  . Years of education: Not on file  . Highest education level: Not on file  Occupational History  . Not on file  Tobacco Use  . Smoking status: Former Smoker    Packs/day: 1.00    Years: 26.00    Pack years: 26.00    Types: Cigarettes    Quit date: 12/13/2014    Years since quitting: 4.8  . Smokeless tobacco: Former Network engineer  . Vaping Use: Never used  Substance and Sexual Activity  . Alcohol use: Not Currently    Comment: occasioanal  . Drug use: Never  . Sexual activity: Not on file  Other Topics Concern  . Not on file  Social History Narrative  . Not on file   Social Determinants of Health   Financial Resource Strain:   . Difficulty of Paying Living Expenses: Not on file  Food Insecurity:   . Worried About Charity fundraiser in the Last Year: Not on file  . Ran Out of Food in the Last Year: Not on file  Transportation Needs:   . Lack of Transportation (Medical): Not on file  . Lack of Transportation (Non-Medical): Not on file  Physical Activity:   . Days of Exercise per Week: Not on file  . Minutes of Exercise per Session: Not on file  Stress:   . Feeling of Stress : Not on file  Social Connections:   . Frequency of Communication with Friends and Family: Not  on file  . Frequency of Social Gatherings with Friends and Family: Not on file  . Attends Religious Services: Not on file  . Active Member of Clubs or Organizations: Not on file  . Attends Archivist Meetings: Not on file  . Marital Status: Not on file    Allergies: No Known Allergies  Metabolic Disorder Labs: Lab Results  Component Value Date   HGBA1C 5.3 10/14/2019   Lab Results  Component Value Date   PROLACTIN 2.9 06/17/2019   Lab Results  Component Value Date   CHOL 182 06/17/2019   TRIG 116 06/17/2019   HDL 44 06/17/2019   CHOLHDL 4.1 06/17/2019  LDLCALC 116 (H) 06/17/2019   Lab Results  Component Value Date   TSH 1.50 06/17/2019    Therapeutic Level Labs: No results found for: LITHIUM No results found for: VALPROATE No components found for:  CBMZ  Current Medications: Current Outpatient Medications  Medication Sig Dispense Refill  . cetirizine (ZYRTEC) 10 MG tablet Take 1 tablet by mouth daily.    . fluticasone (FLONASE) 50 MCG/ACT nasal spray Place 2 sprays into both nostrils daily as needed.     . hydrOXYzine (ATARAX/VISTARIL) 50 MG tablet Take 50-100 mg by mouth at bedtime as needed.    Marland Kitchen ibuprofen (ADVIL) 800 MG tablet Take 1 tablet by mouth 2 (two) times daily as needed.    . lamoTRIgine (LAMICTAL) 100 MG tablet Take 1 tablet (100 mg total) by mouth daily. 30 tablet 1  . metFORMIN (GLUCOPHAGE-XR) 500 MG 24 hr tablet Take 1 tablet (500 mg total) by mouth daily after breakfast. 90 tablet 1  . methylphenidate 36 MG PO CR tablet Take 1 tablet (36 mg total) by mouth daily. 30 tablet 0  . rosuvastatin (CRESTOR) 10 MG tablet Take 1 tablet (10 mg total) by mouth daily. 90 tablet 1  . Testosterone 10 MG/ACT (2%) GEL APPLY 3 PUMPS TO THE SKIN ONCE DAILY 60 g 0  . TRICOR 48 MG tablet Take 1 tablet by mouth daily.     No current facility-administered medications for this visit.     Musculoskeletal: Strength & Muscle Tone: N/A Gait & Station:  N/A Patient leans: N/A  Psychiatric Specialty Exam: Review of Systems  Psychiatric/Behavioral: Positive for decreased concentration. Negative for agitation, behavioral problems, confusion, dysphoric mood, hallucinations, self-injury, sleep disturbance and suicidal ideas. The patient is not nervous/anxious and is not hyperactive.   All other systems reviewed and are negative.   There were no vitals taken for this visit.There is no height or weight on file to calculate BMI.  General Appearance: Fairly Groomed  Eye Contact:  Good  Speech:  Clear and Coherent  Volume:  Normal  Mood:  good  Affect:  Appropriate, Congruent and euthymic  Thought Process:  Coherent  Orientation:  Full (Time, Place, and Person)  Thought Content: Logical   Suicidal Thoughts:  No  Homicidal Thoughts:  No  Memory:  Immediate;   Good  Judgement:  Good  Insight:  Good  Psychomotor Activity:  Normal  Concentration:  Concentration: Good and Attention Span: Good  Recall:  Good  Fund of Knowledge: Good  Language: Good  Akathisia:  No  Handed:  Right  AIMS (if indicated): not done  Assets:  Communication Skills Desire for Improvement  ADL's:  Intact  Cognition: WNL  Sleep:  Good   Screenings:   Assessment and Plan:  Aaron Hunt is a 44 y.o. year old male with a history of GAD, mood disorder, r/o PTSD, non functionalpituitary microadenoma,type 2 diabetes, hyperlipidemia, hypogonadism, who presents for follow up appointment for below.   1. MDD (major depressive disorder), recurrent, in partial remission ([place]) 2. PTSD (post-traumatic stress disorder) He denies significant mood symptoms, and overall improvement in irritability despite tapering down lamotrigine.  Will continue current dose of lamotrigine to target mood dysregulation.  Discussed potential risk of Stevens-Johnson syndrome. Will consider adding antidepressant if any worsening in his mood symptoms.   3. Attention deficit hyperactivity  disorder (ADHD), unspecified ADHD type # Marijuana use He has had significant benefit from Concerta for inattention.  Noted that UDS was positive for marijuana use, although he did  not disclose it during the encounters.  He verbalizes understanding that Concerta will not be continued if he were to continue marijuana.  Will order Concerta only for a month at this time for inattention.   Plan 1.Continue lamotrigine 100 mg daily (he will contact the office of the dose he takes.) 2. Continue Concerta 36 mg daily- this medication will not be continued if you continue to use marijuana, CBD. 3. Next appointment-12/7 at 10:20 for 30 mins,video  I have utilized the [place] Controlled Substances Reporting System (PMP AWARxE) to confirm adherence regarding the patient's medication. My review reveals appropriate prescription fills.    The patient demonstrates the following risk factors for suicide: Chronic risk factors for suicide include:psychiatric disorder ofdepression, PTSD. Acute risk factorsfor suicide include:N/A. Protective factorsfor this patient include: responsibility to others (children, family), coping skills and hope for the future. Considering these factors, the overall suicide risk at this point appears to below. Patientisappropriate for outpatient follow up.  [name], MD 11/01/2019, 9:05 AM

## 2019-10-27 ENCOUNTER — Ambulatory Visit (HOSPITAL_COMMUNITY)
Admission: RE | Admit: 2019-10-27 | Discharge: 2019-10-27 | Disposition: A | Discharge status: Home / Self Care | Source: Ambulatory Visit | Attending: Vascular Surgery | Admitting: Vascular Surgery

## 2019-10-27 ENCOUNTER — Other Ambulatory Visit: Payer: Self-pay

## 2019-10-27 DIAGNOSIS — R6889 Other general symptoms and signs: Secondary | ICD-10-CM | POA: Diagnosis present

## 2019-10-31 ENCOUNTER — Ambulatory Visit (INDEPENDENT_AMBULATORY_CARE_PROVIDER_SITE_OTHER): Admitting: Vascular Surgery

## 2019-10-31 ENCOUNTER — Other Ambulatory Visit: Payer: Self-pay

## 2019-10-31 ENCOUNTER — Encounter: Payer: Self-pay | Admitting: Vascular Surgery

## 2019-10-31 VITALS — BP 115/79 | HR 79 | Temp 97.3°F | Resp 18 | Ht 66.0 in | Wt 282.0 lb

## 2019-10-31 DIAGNOSIS — R6889 Other general symptoms and signs: Secondary | ICD-10-CM

## 2019-10-31 NOTE — Progress Notes (Signed)
[facility] [place]  Patient name: Aaron Hunt MRN: [id number] DOB: 1975/03/18 Sex: male  REASON FOR CONSULT: Discuss recent abnormal screening ankle arm indices  HPI: Aaron Hunt is a 44 y.o. male, here today for discussion of abnormal ankle arm indices.  He is prediabetic.  He does have a pituitary tumor.  He was on Metformin.  Has not been on insulin.  Underwent outpatient screening noninvasive studies which were abnormal and is referred for further discussion.  He does not have any history of tissue loss and no lower extremity claudication.  Past Medical History:  Diagnosis Date  . Back pain   . MRSA (methicillin resistant staph aureus) culture positive   . Pituitary mass ([place])   . Pneumonia     Family History  Problem Relation Age of Onset  . Cancer Mother   . Diabetes Mother   . Cancer Father   . Depression Father   . Drug abuse Father     SOCIAL HISTORY: Social History   Socioeconomic History  . Marital status: Married    Spouse name: Not on file  . Number of children: Not on file  . Years of education: Not on file  . Highest education level: Not on file  Occupational History  . Not on file  Tobacco Use  . Smoking status: Former Smoker    Packs/day: 1.00    Years: 26.00    Pack years: 26.00    Types: Cigarettes    Quit date: 12/13/2014    Years since quitting: 4.8  . Smokeless tobacco: Former Network engineer  . Vaping Use: Never used  Substance and Sexual Activity  . Alcohol use: Not Currently    Comment: occasioanal  . Drug use: Never  . Sexual activity: Not on file  Other Topics Concern  . Not on file  Social History Narrative  . Not on file   Social Determinants of Health   Financial Resource Strain:   . Difficulty of Paying Living Expenses: Not on file  Food Insecurity:   . Worried About Charity fundraiser in the Last Year: Not on file  . Ran Out of Food in the Last Year: Not on file  Transportation  Needs:   . Lack of Transportation (Medical): Not on file  . Lack of Transportation (Non-Medical): Not on file  Physical Activity:   . Days of Exercise per Week: Not on file  . Minutes of Exercise per Session: Not on file  Stress:   . Feeling of Stress : Not on file  Social Connections:   . Frequency of Communication with Friends and Family: Not on file  . Frequency of Social Gatherings with Friends and Family: Not on file  . Attends Religious Services: Not on file  . Active Member of Clubs or Organizations: Not on file  . Attends Archivist Meetings: Not on file  . Marital Status: Not on file  Intimate Partner Violence:   . Fear of Current or Ex-Partner: Not on file  . Emotionally Abused: Not on file  . Physically Abused: Not on file  . Sexually Abused: Not on file    No Known Allergies  Current Outpatient Medications  Medication Sig Dispense Refill  . cetirizine (ZYRTEC) 10 MG tablet Take 1 tablet by mouth daily.    . fluticasone (FLONASE) 50 MCG/ACT nasal spray Place 2 sprays into both nostrils daily as needed.     . hydrOXYzine (ATARAX/VISTARIL) 50 MG  tablet Take 50-100 mg by mouth at bedtime as needed.    Marland Kitchen ibuprofen (ADVIL) 800 MG tablet Take 1 tablet by mouth 2 (two) times daily as needed.    . lamoTRIgine (LAMICTAL) 100 MG tablet Take 1 tablet (100 mg total) by mouth 2 (two) times daily. 60 tablet 0  . metFORMIN (GLUCOPHAGE-XR) 500 MG 24 hr tablet Take 1 tablet (500 mg total) by mouth daily after breakfast. 90 tablet 1  . methylphenidate 36 MG PO CR tablet Take 1 tablet (36 mg total) by mouth daily. 30 tablet 0  . rosuvastatin (CRESTOR) 10 MG tablet Take 1 tablet (10 mg total) by mouth daily. 90 tablet 1  . Testosterone 10 MG/ACT (2%) GEL APPLY 3 PUMPS TO THE SKIN ONCE DAILY 60 g 0  . TRICOR 48 MG tablet Take 1 tablet by mouth daily.     No current facility-administered medications for this visit.    REVIEW OF SYSTEMS:  [X]  denotes positive finding, [ ]   denotes negative finding Cardiac  Comments:  Chest pain or chest pressure:    Shortness of breath upon exertion:    Short of breath when lying flat:    Irregular heart rhythm:        Vascular    Pain in calf, thigh, or hip brought on by ambulation:    Pain in feet at night that wakes you up from your sleep:     Blood clot in your veins:    Leg swelling:         Pulmonary    Oxygen at home:    Productive cough:     Wheezing:         Neurologic    Sudden weakness in arms or legs:     Sudden numbness in arms or legs:     Sudden onset of difficulty speaking or slurred speech:    Temporary loss of vision in one eye:     Problems with dizziness:         Gastrointestinal    Blood in stool:     Vomited blood:         Genitourinary    Burning when urinating:     Blood in urine:        Psychiatric    Major depression:         Hematologic    Bleeding problems:    Problems with blood clotting too easily:        Skin    Rashes or ulcers:        Constitutional    Fever or chills:      PHYSICAL EXAM: Vitals:   10/31/19 0838  BP: 115/79  Pulse: 79  Resp: 18  Temp: (!) 97.3 F (36.3 C)  TempSrc: Other (Comment)  SpO2: 98%  Weight: 282 lb (127.9 kg)  Height: 5\' 6"  (1.676 m)    GENERAL: The patient is a well-nourished male, in no acute distress. The vital signs are documented above. CARDIAC: There is a regular rate and rhythm.  VASCULAR: 2+ radial pulses bilaterally.  Carotid arteries without bruits bilaterally.  2+ dorsalis pedis and 2+ posterior tibial pulses bilaterally PULMONARY: There is good air exchange bilaterally without wheezing or rales. ABDOMEN: Soft and non-tender with normal pitched bowel sounds.  MUSCULOSKELETAL: There are no major deformities or cyanosis. NEUROLOGIC: No focal weakness or paresthesias are detected. SKIN: There are no ulcers or rashes noted. PSYCHIATRIC: The patient has a normal affect.  DATA:   Noninvasive studies  at [facility] on 10/27/2019 were reviewed with the patient.  This shows normal ankle arm index and normal waveforms bilaterally  MEDICAL ISSUES:  Discussed these findings with the patient.  He does not have any evidence of arterial insufficiency.  I explained that he had a false positive screening study as an outpatient but that his physical exam and formal noninvasive studies were normal.  He was reassured with this discussion will see [place] again on an as-needed basis   Curt Jews [facility] phone [phone]

## 2019-11-01 ENCOUNTER — Encounter (HOSPITAL_COMMUNITY): Payer: Self-pay | Admitting: Psychiatry

## 2019-11-01 ENCOUNTER — Telehealth (INDEPENDENT_AMBULATORY_CARE_PROVIDER_SITE_OTHER): Admitting: Psychiatry

## 2019-11-01 DIAGNOSIS — F4310 Post-traumatic stress disorder, unspecified: Secondary | ICD-10-CM

## 2019-11-01 DIAGNOSIS — F909 Attention-deficit hyperactivity disorder, unspecified type: Secondary | ICD-10-CM

## 2019-11-01 DIAGNOSIS — F3341 Major depressive disorder, recurrent, in partial remission: Secondary | ICD-10-CM

## 2019-11-01 MED ORDER — LAMOTRIGINE 100 MG PO TABS
100.0000 mg | ORAL_TABLET | Freq: Every day | ORAL | 1 refills | Status: DC
Start: 2019-11-01 — End: 2019-12-13

## 2019-11-01 MED ORDER — METHYLPHENIDATE HCL ER (OSM) 36 MG PO TBCR
36.0000 mg | EXTENDED_RELEASE_TABLET | Freq: Every day | ORAL | 0 refills | Status: DC
Start: 2019-11-01 — End: 2020-01-10

## 2019-11-01 NOTE — Patient Instructions (Signed)
1.Continue lamotrigine 100 mg daily 2. Continue Concerta 36 mg daily- this medication will not be continued if you continue to use marijuana, CBD. 3. Next appointment-12/7 at 10:20

## 2019-11-16 LAB — HEMOGLOBIN A1C: Hemoglobin A1C: 5.3

## 2019-12-05 NOTE — Progress Notes (Signed)
Virtual Visit via Video Note  I connected with Aaron Hunt on 12/13/19 at 10:20 AM EST by a video enabled telemedicine application and verified that I am speaking with the correct person using two identifiers.  Location: Patient: car Provider: office   I discussed the limitations of evaluation and management by telemedicine and the availability of in person appointments. The patient expressed understanding and agreed to proceed.    I discussed the assessment and treatment plan with the patient. The patient was provided an opportunity to ask questions and all were answered. The patient agreed with the plan and demonstrated an understanding of the instructions.   The patient was advised to call back or seek an in-person evaluation if the symptoms worsen or if the condition fails to improve as anticipated.  I provided 20 minutes of non-face-to-face time during this encounter.   Aaron Clay, MD    [facility] MD/PA/NP OP Progress Note  12/13/2019 10:51 AM Aaron Hunt  MRN:  [id number]  Chief Complaint:  Chief Complaint    Follow-up; Trauma; Depression     HPI:  This is a follow-up appointment for depression, PTSD and inattention.  He states that he is not doing well this month.  He is angry, and has been passive-aggressive.  He feels that whenever he does is wrong to other people.  Although he denies other people commented on it, that is how he feels.  He feels horrible that he could not go to the hoarse event with his daughter as he knew that he may have said something to others.  He tends to feel stressed due to life stress of getting presents for Christmas, issues with his vehicles, and back pain. He self uptitrated lamotrigine as he thought it was too low.  When he is provided psychoeducation of the importance of contacting the office before self adjusting his medication, he states that he needed to do something.  He sleeps well.  He feels depressed.  He has increase in appetite.  He  has anhedonia.  He denies SI.  He has difficulty in concentration.  He is misplacing things, and being forgetful.  He uses marijuana few times a week.  He verbalizes understanding that Concerta would not be prescribed due to his marijuana use.    Used to be 260 lbs in 03-30-2018 Wt Readings from Last 3 Encounters:  10/31/19 282 lb (127.9 kg)  10/14/19 273 lb (123.8 kg)  06/24/19 272 lb 12.8 oz (123.7 kg)    Daily routine:work, works on garden, takes care of his chicken, enjoys fishing on weekend, Employment:works M-Fri for [employer] since 03/30/2014. Medically retired from [employer] 30-Mar-2014 due to back injury, and pituitary adenoma, served 18 years 22 months, [place], working on vehicles Household:wife, 52 and 30 year old children Marital status:marriedsince 03/30/07, divorced in 03-30-02 Number of children:67.81, 18, 11 year old in [place] Education-Bachelor degree. He states that his father was alcoholic, and the patient was "never good enough." He had estranged relationship with him, although he is trying again after he has children. He is cognizant of issues around alcohol, and tries to be a better person for his children. He has great relationship with his mother, who deceased in 03/30/06.    Visit Diagnosis:    ICD-10-CM   1. MDD (major depressive disorder), recurrent episode, mild ([place])  F33.0   2. PTSD (post-traumatic stress disorder)  F43.10     Past Psychiatric History: Please see initial evaluation for full details. I  have reviewed the history. No updates at this time.     Past Medical History:  Past Medical History:  Diagnosis Date  . Back pain   . MRSA (methicillin resistant staph aureus) culture positive   . Pituitary mass ([place])   . Pneumonia     Past Surgical History:  Procedure Laterality Date  . BACK SURGERY    . EYE SURGERY Bilateral   . finger surgery Right    littl;e finger x2  . FOOT SURGERY Right   . KNEE SURGERY Left      Family Psychiatric History: Please see initial evaluation for full details. I have reviewed the history. No updates at this time.     Family History:  Family History  Problem Relation Age of Onset  . Cancer Mother   . Diabetes Mother   . Cancer Father   . Depression Father   . Drug abuse Father     Social History:  Social History   Socioeconomic History  . Marital status: Married    Spouse name: Not on file  . Number of children: Not on file  . Years of education: Not on file  . Highest education level: Not on file  Occupational History  . Not on file  Tobacco Use  . Smoking status: Former Smoker    Packs/day: 1.00    Years: 26.00    Pack years: 26.00    Types: Cigarettes    Quit date: 12/13/2014    Years since quitting: 5.0  . Smokeless tobacco: Former Network engineer  . Vaping Use: Never used  Substance and Sexual Activity  . Alcohol use: Not Currently    Comment: occasioanal  . Drug use: Never  . Sexual activity: Not on file  Other Topics Concern  . Not on file  Social History Narrative  . Not on file   Social Determinants of Health   Financial Resource Strain:   . Difficulty of Paying Living Expenses: Not on file  Food Insecurity:   . Worried About Charity fundraiser in the Last Year: Not on file  . Ran Out of Food in the Last Year: Not on file  Transportation Needs:   . Lack of Transportation (Medical): Not on file  . Lack of Transportation (Non-Medical): Not on file  Physical Activity:   . Days of Exercise per Week: Not on file  . Minutes of Exercise per Session: Not on file  Stress:   . Feeling of Stress : Not on file  Social Connections:   . Frequency of Communication with Friends and Family: Not on file  . Frequency of Social Gatherings with Friends and Family: Not on file  . Attends Religious Services: Not on file  . Active Member of Clubs or Organizations: Not on file  . Attends Archivist Meetings: Not on file  . Marital  Status: Not on file    Allergies: No Known Allergies  Metabolic Disorder Labs: Lab Results  Component Value Date   HGBA1C 5.3 10/14/2019   Lab Results  Component Value Date   PROLACTIN 2.9 06/17/2019   Lab Results  Component Value Date   CHOL 182 06/17/2019   TRIG 116 06/17/2019   HDL 44 06/17/2019   CHOLHDL 4.1 06/17/2019   LDLCALC 116 (H) 06/17/2019   Lab Results  Component Value Date   TSH 1.50 06/17/2019    Therapeutic Level Labs: No results found for: LITHIUM No results found for: VALPROATE No components found for:  CBMZ  Current Medications: Current Outpatient Medications  Medication Sig Dispense Refill  . cetirizine (ZYRTEC) 10 MG tablet Take 1 tablet by mouth daily.    . fluticasone (FLONASE) 50 MCG/ACT nasal spray Place 2 sprays into both nostrils daily as needed.     . hydrOXYzine (ATARAX/VISTARIL) 50 MG tablet Take 50-100 mg by mouth at bedtime as needed.    Marland Kitchen ibuprofen (ADVIL) 800 MG tablet Take 1 tablet by mouth 2 (two) times daily as needed.    Derrill Memo ON 01/01/2020] lamoTRIgine (LAMICTAL) 150 MG tablet Take 1 tablet (150 mg total) by mouth daily. 30 tablet 0  . metFORMIN (GLUCOPHAGE-XR) 500 MG 24 hr tablet Take 1 tablet (500 mg total) by mouth daily after breakfast. 90 tablet 1  . methylphenidate 36 MG PO CR tablet Take 1 tablet (36 mg total) by mouth daily. 30 tablet 0  . rosuvastatin (CRESTOR) 10 MG tablet Take 1 tablet (10 mg total) by mouth daily. 90 tablet 1  . Testosterone 10 MG/ACT (2%) GEL APPLY 3 PUMPS TO THE SKIN ONCE DAILY 60 g 0  . TRICOR 48 MG tablet Take 1 tablet by mouth daily.     No current facility-administered medications for this visit.     Musculoskeletal: Strength & Muscle Tone: N/A Gait & Station: N/A Patient leans: N/A  Psychiatric Specialty Exam: Review of Systems  Psychiatric/Behavioral: Positive for decreased concentration and dysphoric mood. Negative for agitation, behavioral problems, confusion, hallucinations,  self-injury, sleep disturbance and suicidal ideas. The patient is nervous/anxious. The patient is not hyperactive.   All other systems reviewed and are negative.   There were no vitals taken for this visit.There is no height or weight on file to calculate BMI.  General Appearance: Fairly Groomed  Eye Contact:  Good  Speech:  Clear and Coherent  Volume:  Normal  Mood:  Irritable  Affect:  Appropriate, Congruent and Restricted  Thought Process:  Coherent  Orientation:  Full (Time, Place, and Person)  Thought Content: Logical   Suicidal Thoughts:  No  Homicidal Thoughts:  No  Memory:  Immediate;   Good  Judgement:  Good  Insight:  Fair  Psychomotor Activity:  Normal  Concentration:  Concentration: Good and Attention Span: Good  Recall:  Good  Fund of Knowledge: Good  Language: Good  Akathisia:  No  Handed:  Right  AIMS (if indicated): not done  Assets:  Communication Skills Desire for Improvement  ADL's:  Intact  Cognition: WNL  Sleep:  Good   Screenings:   Assessment and Plan:  Rondy Krupinski is a 44 y.o. year old male with a history of  GAD, mood disorder, r/o PTSD, non functionalpituitary microadenoma,type 2 diabetes, hyperlipidemia, hypogonadism, who presents for follow up appointment for below.   1. MDD (major depressive disorder), recurrent episode, mild ([place]) 2. PTSD (post-traumatic stress disorder) He reports significant worsening in irritability since last visit.  Psychosocial stressors includes holiday season, issues with his vehicles, and back pain.  Will uptitrate lamotrigine to optimize treatment for his irritability.  Discussed potential risk of Stevens-Johnson syndrome.  Will consider adding duloxetine in the future if there is limited benefit from this up titration.   #. Inattention # Marijuana use Although he did have significant benefit from up titration of Concerta, he now has more struggle with inattention.  Although it has been discussed that  Concerta will not be continued if he were to continue to use marijuana, he is at precontemplative stage for marijuana use.  Will not  fill Concerta at this time.   He has had significant benefit from Concerta for inattention.  Noted that UDS was positive for marijuana use, although he did not disclose it during the encounters.  He verbalizes understanding that Concerta will not be continued if he were to continue marijuana.  Will order Concerta only for a month at this time for inattention.   Plan 1.Increase lamotrigine 150 mg daily  2. Discontinue Concerta- he is not interested in discontinuation of marijuana 3. Next appointment- 1/4 at 10 AM for30 mins,video   The patient demonstrates the following risk factors for suicide: Chronic risk factors for suicide include:psychiatric disorder ofdepression, PTSD. Acute risk factorsfor suicide include:N/A. Protective factorsfor this patient include: responsibility to others (children, family), coping skills and hope for the future. Considering these factors, the overall suicide risk at this point appears to below. Patientisappropriate for outpatient follow up.   Aaron Clay, MD 12/13/2019, 10:51 AM

## 2019-12-12 ENCOUNTER — Other Ambulatory Visit: Payer: Self-pay

## 2019-12-12 DIAGNOSIS — G5600 Carpal tunnel syndrome, unspecified upper limb: Secondary | ICD-10-CM

## 2019-12-13 ENCOUNTER — Other Ambulatory Visit: Payer: Self-pay

## 2019-12-13 ENCOUNTER — Encounter: Payer: Self-pay | Admitting: Psychiatry

## 2019-12-13 ENCOUNTER — Encounter: Admitting: Neurology

## 2019-12-13 ENCOUNTER — Ambulatory Visit (INDEPENDENT_AMBULATORY_CARE_PROVIDER_SITE_OTHER): Admitting: Neurology

## 2019-12-13 ENCOUNTER — Telehealth (INDEPENDENT_AMBULATORY_CARE_PROVIDER_SITE_OTHER): Admitting: Psychiatry

## 2019-12-13 DIAGNOSIS — F4310 Post-traumatic stress disorder, unspecified: Secondary | ICD-10-CM

## 2019-12-13 DIAGNOSIS — M79601 Pain in right arm: Secondary | ICD-10-CM | POA: Insufficient documentation

## 2019-12-13 DIAGNOSIS — G5600 Carpal tunnel syndrome, unspecified upper limb: Secondary | ICD-10-CM

## 2019-12-13 DIAGNOSIS — F330 Major depressive disorder, recurrent, mild: Secondary | ICD-10-CM

## 2019-12-13 DIAGNOSIS — Z0289 Encounter for other administrative examinations: Secondary | ICD-10-CM

## 2019-12-13 DIAGNOSIS — R202 Paresthesia of skin: Secondary | ICD-10-CM | POA: Diagnosis not present

## 2019-12-13 DIAGNOSIS — R200 Anesthesia of skin: Secondary | ICD-10-CM | POA: Insufficient documentation

## 2019-12-13 MED ORDER — LAMOTRIGINE 150 MG PO TABS
150.0000 mg | ORAL_TABLET | Freq: Every day | ORAL | 0 refills | Status: DC
Start: 2020-01-01 — End: 2020-01-10

## 2019-12-13 NOTE — Patient Instructions (Signed)
1.Increase lamotrigine 150 mg daily  2. Concerta will not be filled  3. Next appointment- 1/4 at 10 AM

## 2019-12-13 NOTE — Progress Notes (Signed)
Full Name: Florence Antonelli Gender: Male MRN #: [id number] Date of Birth: 1975/09/10    Visit Date: 12/13/2019 07:25 Age: 44 Years Examining Physician: Arlice Colt, MD  Referring Physician: Monico Blitz, MD    History: Mr. Brathwaite is a 44 year old man with numbness in the 3rd and 4th fingers of the right hand that intermittently worsens to include the entire hand.  He also reports pain near the elbow and shoulder.  On exam, strength was normal in the arms.  He reported reduced sensation in the 3rd and 4th fingers and hyperpathia at the thenar eminence.  Nerve conduction studies: Bilateral median and ulnar are motor responses had normal distal latencies, amplitudes and arm conduction velocities.  The right median sensory response was mildly slowed across the wrist with a borderline normal amplitude.  The left median and both ulnar sensory responses were normal.  Transcarpal comparison of the right ulnar and median responses showed an increase peak latency difference with slowing of the palmar median mixed sensory response.  Ulnar F-wave latencies were normal.  Electromyography: Needle EMG of selected muscles of the right arm was performed.  There was mild chronic denervation in the right APB muscle.  The biceps and EDC muscles had some polyphasic motor units but recruitment was normal.  Other muscles tested were normal.  There was no abnormal spontaneous activity.  Impression: This NCV/EMG study shows the following: 1.   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   No evidence of significant radiculopathy though a minimal C6 or C7 chronic radiculopathy cannot be ruled out  Ceceilia Cephus A. Felecia Shelling, MD, PhD, FAAN Certified in Neurology, Clinical Neurophysiology, Sleep Medicine, Pain Medicine and Neuroimaging Director, [facility] [address] [place], [place]  [address] [phone]   Verbal informed consent was obtained from the patient, patient was informed of potential risk of procedure, including bruising, bleeding, hematoma formation, infection, muscle weakness, muscle pain, numbness, among others.         [place]    Nerve / Sites Muscle Latency Ref. Amplitude Ref. Rel Amp Segments Distance Velocity Ref. Area    ms ms mV mV %  cm m/s m/s mVms  L Median - APB     Wrist APB 3.4 ?4.4 8.6 ?4.0 100 Wrist - APB 7   38.3     Upper arm APB 7.5  7.0  81.7 Upper arm - Wrist 21 51 ?49 26.7  R Median - APB     Wrist APB 3.8 ?4.4 5.5 ?4.0 100 Wrist - APB 7   18.8     Upper arm APB 7.9  5.3  96.2 Upper arm - Wrist 20 49 ?49 18.7  L Ulnar - ADM     Wrist ADM 2.5 ?3.3 9.9 ?6.0 100 Wrist - ADM 7   32.6     B.Elbow ADM 5.8  10.3  103 B.Elbow - Wrist 18 54 ?49 32.3     A.Elbow ADM 7.7  10.1  98.6 A.Elbow - B.Elbow 10 53 ?49 31.6         A.Elbow - Wrist      R Ulnar - ADM     Wrist ADM 2.7 ?3.3 13.1 ?6.0 100 Wrist - ADM 7   36.5     B.Elbow ADM 6.0  13.1  99.8 B.Elbow - Wrist 20 60 ?49 34.8     A.Elbow ADM 7.7  13.0  99.1 A.Elbow - B.Elbow 10 58 ?49 33.9         A.Elbow - Wrist                 SNC    Nerve / Sites Rec. Site Peak Lat Ref.  Amp Ref. Segments Distance Peak Diff Ref.    ms ms V V  cm ms ms  R Median, Ulnar - Transcarpal comparison     Median Palm Wrist 2.7 ?2.2 56 ?35 Median Palm - Wrist 8       Ulnar Palm Wrist 1.7 ?2.2 28 ?12 Ulnar Palm - Wrist 8          Median Palm - Ulnar Palm  0.9 ?0.4  L Median - Orthodromic (Dig II, Mid palm)     Dig II Wrist 2.9 ?3.4 21 ?10 Dig II - Wrist 13    R Median - Orthodromic (Dig II, Mid palm)     Dig II Wrist 3.5 ?3.4 12 ?10 Dig II - Wrist 13    L Ulnar - Orthodromic, (Dig V, Mid palm)     Dig V Wrist 2.4 ?3.1 11 ?5 Dig V - Wrist 11    R Ulnar - Orthodromic, (Dig V, Mid palm)     Dig V Wrist 2.4 ?3.1 12 ?5 Dig V - Wrist 73                 F  Wave    Nerve F Lat Ref.   ms ms  L Ulnar - ADM 28.0  ?32.0  R Ulnar - ADM 28.0 ?32.0         EMG Summary Table    Spontaneous MUAP Recruitment  Muscle IA Fib PSW Fasc Other Amp Dur. Poly Pattern  R. Deltoid Normal None None None _______ Normal Normal Normal Normal  R. Triceps brachii Normal None None None _______ Normal Normal Normal Normal  R. Biceps brachii Normal None None None _______ Normal Normal 1+ Normal  R. Extensor digitorum communis Normal None None None _______ Normal Normal 1+ Normal  R. Flexor carpi ulnaris Normal None None None _______ Normal Normal Normal Normal  R. First dorsal interosseous Normal None None None _______ Normal Normal Normal Normal  R. Abductor pollicis brevis Normal None None None _______ Normal Normal 1+ Reduced  R. Flexor pollicis longus Normal None None None _______ Normal Normal Normal Normal

## 2019-12-27 NOTE — Progress Notes (Signed)
Virtual Visit via Video Note  I connected with Aaron Hunt on 01/10/20 at 10:00 AM EST by a video enabled telemedicine application and verified that I am speaking with the correct person using two identifiers.  Location: Patient: home Provider: office Persons participated in the visit- patient, provider   I discussed the limitations of evaluation and management by telemedicine and the availability of in person appointments. The patient expressed understanding and agreed to proceed.   I discussed the assessment and treatment plan with the patient. The patient was provided an opportunity to ask questions and all were answered. The patient agreed with the plan and demonstrated an understanding of the instructions.   The patient was advised to call back or seek an in-person evaluation if the symptoms worsen or if the condition fails to improve as anticipated.  I provided 15 minutes of non-face-to-face time during this encounter.   [name], MD    [facility] MD/PA/NP OP Progress Note  01/10/2020 10:31 AM Aaron Hunt  MRN:  [id number]  Chief Complaint:  Chief Complaint    Follow-up; Depression     HPI:  This is a follow-up appointment for depression.  He states that he is doing much better since up titration of lamotrigine.  He feels back to himself.  His wife and his children feels the same way.  He tries not to be argumentative as he realized that he would not be able to fix other people.  He has more introspective thinking rather than reacting.  He enjoys his work.he had good time on holidays with his family, although he did miss people who he lost in the past few years.  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  He sleeps well.  He denies feeling depressed.  Although he still have issues with focus, he tries to organize and prioritize things better.  He gained 10 pounds during holiday season, and has lost 5 pounds since then.  He denies SI.  He denies  nightmares, flashbacks, hypervigilance.  He drinks a few glasses of wine or mixed drink every week.  He uses marijuana every day for irritability.  He states that it works for him well, and is planning to continue using marijuana.    Daily routine:work, works on garden, takes care of his chicken, enjoys fishing on weekend, Employment:works M-Fri for [employer] since April 12, 2014. Medically retired from [employer] 04/12/14 due to back injury, and pituitary adenoma, served 18 years 72 months, [place], working on vehicles Household:wife, 37 and 26 year old children Marital status:marriedsince 04-12-2007, divorced in 04/12/02 Number of children:55.46, 20, 15 year old in [place] Education-Bachelor degree. He states that his father was alcoholic, and the patient was "never good enough." He had estranged relationship with him, although he is trying again after he has children. He is cognizant of issues around alcohol, and tries to be a better person for his children. He has great relationship with his mother, who deceased in 2006/04/12.    Visit Diagnosis:    ICD-10-CM   1. MDD (major depressive disorder), recurrent, in partial remission ([place])  F33.41   2. PTSD (post-traumatic stress disorder)  F43.10     Past Psychiatric History: Please see initial evaluation for full details. I have reviewed the history. No updates at this time.     Past Medical History:  Past Medical History:  Diagnosis Date  . Back pain   . MRSA (methicillin resistant staph aureus) culture positive   .  Pituitary mass ([place])   . Pneumonia     Past Surgical History:  Procedure Laterality Date  . BACK SURGERY    . EYE SURGERY Bilateral   . finger surgery Right    littl;e finger x2  . FOOT SURGERY Right   . KNEE SURGERY Left     Family Psychiatric History: Please see initial evaluation for full details. I have reviewed the history. No updates at this time.     Family History:  Family History   Problem Relation Age of Onset  . Cancer Mother   . Diabetes Mother   . Cancer Father   . Depression Father   . Drug abuse Father     Social History:  Social History   Socioeconomic History  . Marital status: Married    Spouse name: Not on file  . Number of children: Not on file  . Years of education: Not on file  . Highest education level: Not on file  Occupational History  . Not on file  Tobacco Use  . Smoking status: Former Smoker    Packs/day: 1.00    Years: 26.00    Pack years: 26.00    Types: Cigarettes    Quit date: 12/13/2014    Years since quitting: 5.0  . Smokeless tobacco: Former Network engineer  . Vaping Use: Never used  Substance and Sexual Activity  . Alcohol use: Not Currently    Comment: occasioanal  . Drug use: Never  . Sexual activity: Not on file  Other Topics Concern  . Not on file  Social History Narrative  . Not on file   Social Determinants of Health   Financial Resource Strain: Not on file  Food Insecurity: Not on file  Transportation Needs: Not on file  Physical Activity: Not on file  Stress: Not on file  Social Connections: Not on file    Allergies: No Known Allergies  Metabolic Disorder Labs: Lab Results  Component Value Date   HGBA1C 5.3 10/14/2019   Lab Results  Component Value Date   PROLACTIN 2.9 06/17/2019   Lab Results  Component Value Date   CHOL 182 06/17/2019   TRIG 116 06/17/2019   HDL 44 06/17/2019   CHOLHDL 4.1 06/17/2019   LDLCALC 116 (H) 06/17/2019   Lab Results  Component Value Date   TSH 1.50 06/17/2019    Therapeutic Level Labs: No results found for: LITHIUM No results found for: VALPROATE No components found for:  CBMZ  Current Medications: Current Outpatient Medications  Medication Sig Dispense Refill  . cetirizine (ZYRTEC) 10 MG tablet Take 1 tablet by mouth daily.    . fluticasone (FLONASE) 50 MCG/ACT nasal spray Place 2 sprays into both nostrils daily as needed.     Marland Kitchen ibuprofen  (ADVIL) 800 MG tablet Take 1 tablet by mouth 2 (two) times daily as needed.    Derrill Memo ON 01/30/2020] lamoTRIgine (LAMICTAL) 150 MG tablet Take 1 tablet (150 mg total) by mouth daily. 90 tablet 0  . metFORMIN (GLUCOPHAGE-XR) 500 MG 24 hr tablet Take 1 tablet (500 mg total) by mouth daily after breakfast. 90 tablet 1  . rosuvastatin (CRESTOR) 10 MG tablet TAKE 1 TABLET(10 MG) BY MOUTH DAILY 90 tablet 1  . Testosterone 10 MG/ACT (2%) GEL APPLY 3 PUMPS TO THE SKIN ONCE DAILY 60 g 0  . TRICOR 48 MG tablet Take 1 tablet by mouth daily.     No current facility-administered medications for this visit.  Musculoskeletal: Strength & Muscle Tone: N/A Gait & Station: N/A Patient leans: N/A  Psychiatric Specialty Exam: Review of Systems  Psychiatric/Behavioral: Positive for decreased concentration. Negative for agitation, behavioral problems, confusion, dysphoric mood, hallucinations, self-injury, sleep disturbance and suicidal ideas. The patient is not nervous/anxious and is not hyperactive.   All other systems reviewed and are negative.   There were no vitals taken for this visit.There is no height or weight on file to calculate BMI.  General Appearance: Fairly Groomed  Eye Contact:  Good  Speech:  Clear and Coherent  Volume:  Normal  Mood:  better  Affect:  Appropriate, Congruent and calm, reactive  Thought Process:  Coherent  Orientation:  Full (Time, Place, and Person)  Thought Content: Logical   Suicidal Thoughts:  No  Homicidal Thoughts:  No  Memory:  Immediate;   Good  Judgement:  Good  Insight:  Good  Psychomotor Activity:  Normal  Concentration:  Concentration: Good and Attention Span: Good  Recall:  Good  Fund of Knowledge: Good  Language: Good  Akathisia:  No  Handed:  Right  AIMS (if indicated): not done  Assets:  Communication Skills Desire for Improvement  ADL's:  Intact  Cognition: WNL  Sleep:  Good   Screenings:   Assessment and Plan:  Susie Bubel is a  44 y.o. year old male with a history of GAD, mood disorder, r/o PTSD, non functionalpituitary microadenoma,type 2 diabetes, hyperlipidemia, hypogonadism, who presents for follow up appointment for below.   1. MDD (major depressive disorder), recurrent, in partial remission ([place]) 2. PTSD (post-traumatic stress disorder) There has been significant improvement in irritability since up titration of lamotrigine.  Will continue current dose of lamotrigine for irritability.  Discussed potential risk of Stevens-Johnson syndrome.   # Marijuana use He is comfortable continuing marijuana use despite psycho education on its potential long-term adverse reaction.  We will continue motivational interview.   Plan 1.Continue  lamotrigine 150 mg daily  3. Next appointment-4/5 at 8:40 for 20 mins,video   The patient demonstrates the following risk factors for suicide: Chronic risk factors for suicide include:psychiatric disorder ofdepression, PTSD. Acute risk factorsfor suicide include:N/A. Protective factorsfor this patient include: responsibility to others (children, family), coping skills and hope for the future. Considering these factors, the overall suicide risk at this point appears to below. Patientisappropriate for outpatient follow up.  [name], MD 01/10/2020, 10:31 AM

## 2020-01-08 ENCOUNTER — Other Ambulatory Visit: Payer: Self-pay | Admitting: "Endocrinology

## 2020-01-10 ENCOUNTER — Telehealth (INDEPENDENT_AMBULATORY_CARE_PROVIDER_SITE_OTHER): Admitting: Psychiatry

## 2020-01-10 ENCOUNTER — Other Ambulatory Visit: Payer: Self-pay

## 2020-01-10 ENCOUNTER — Encounter: Payer: Self-pay | Admitting: Psychiatry

## 2020-01-10 DIAGNOSIS — F3341 Major depressive disorder, recurrent, in partial remission: Secondary | ICD-10-CM | POA: Diagnosis not present

## 2020-01-10 DIAGNOSIS — F4310 Post-traumatic stress disorder, unspecified: Secondary | ICD-10-CM | POA: Diagnosis not present

## 2020-01-10 MED ORDER — LAMOTRIGINE 150 MG PO TABS
150.0000 mg | ORAL_TABLET | Freq: Every day | ORAL | 0 refills | Status: DC
Start: 2020-01-30 — End: 2020-06-22

## 2020-01-10 NOTE — Patient Instructions (Signed)
1.Continue  lamotrigine 150 mg daily  2. Next appointment-4/5 at 8:40

## 2020-01-30 ENCOUNTER — Other Ambulatory Visit: Payer: Self-pay

## 2020-01-30 DIAGNOSIS — E291 Testicular hypofunction: Secondary | ICD-10-CM

## 2020-01-30 MED ORDER — TESTOSTERONE 25 MG/2.5GM (1%) TD GEL: TRANSDERMAL | 0 refills | Status: DC

## 2020-02-04 LAB — COMPREHENSIVE METABOLIC PANEL
ALT: 38 IU/L (ref 0–44)
AST: 23 IU/L (ref 0–40)
Albumin/Globulin Ratio: 2.1 (ref 1.2–2.2)
Albumin: 4.6 g/dL (ref 4.0–5.0)
Alkaline Phosphatase: 59 IU/L (ref 44–121)
BUN/Creatinine Ratio: 26 — ABNORMAL HIGH (ref 9–20)
BUN: 21 mg/dL (ref 6–24)
Bilirubin Total: 0.3 mg/dL (ref 0.0–1.2)
CO2: 19 mmol/L — ABNORMAL LOW (ref 20–29)
Calcium: 9.3 mg/dL (ref 8.7–10.2)
Chloride: 105 mmol/L (ref 96–106)
Creatinine, Ser: 0.8 mg/dL (ref 0.76–1.27)
GFR calc Af Amer: 126 mL/min/{1.73_m2} (ref >59)
GFR calc non Af Amer: 109 mL/min/{1.73_m2} (ref >59)
Globulin, Total: 2.2 g/dL (ref 1.5–4.5)
Glucose: 86 mg/dL (ref 65–99)
Potassium: 4.7 mmol/L (ref 3.5–5.2)
Sodium: 139 mmol/L (ref 134–144)
Total Protein: 6.8 g/dL (ref 6.0–8.5)

## 2020-02-04 LAB — TESTOSTERONE, FREE, TOTAL, SHBG
Sex Hormone Binding: 27 nmol/L (ref 16.5–55.9)
Testosterone, Free: 15.8 pg/mL (ref 6.8–21.5)
Testosterone: 265 ng/dL (ref 264–916)

## 2020-02-04 LAB — LIPID PANEL
Chol/HDL Ratio: 3.5 ratio (ref 0.0–5.0)
Cholesterol, Total: 159 mg/dL (ref 100–199)
HDL: 46 mg/dL (ref >39)
LDL Chol Calc (NIH): 87 mg/dL (ref 0–99)
Triglycerides: 149 mg/dL (ref 0–149)
VLDL Cholesterol Cal: 26 mg/dL (ref 5–40)

## 2020-02-04 LAB — PSA: Prostate Specific Ag, Serum: 0.6 ng/mL (ref 0.0–4.0)

## 2020-02-04 LAB — VITAMIN D 25 HYDROXY (VIT D DEFICIENCY, FRACTURES): Vit D, 25-Hydroxy: 14.9 ng/mL — ABNORMAL LOW (ref 30.0–100.0)

## 2020-02-17 ENCOUNTER — Encounter: Payer: Self-pay | Admitting: "Endocrinology

## 2020-02-17 ENCOUNTER — Ambulatory Visit (INDEPENDENT_AMBULATORY_CARE_PROVIDER_SITE_OTHER): Admitting: "Endocrinology

## 2020-02-17 ENCOUNTER — Other Ambulatory Visit: Payer: Self-pay

## 2020-02-17 VITALS — BP 116/74 | HR 68 | Ht 66.0 in | Wt 291.0 lb

## 2020-02-17 DIAGNOSIS — E782 Mixed hyperlipidemia: Secondary | ICD-10-CM

## 2020-02-17 DIAGNOSIS — D352 Benign neoplasm of pituitary gland: Secondary | ICD-10-CM

## 2020-02-17 DIAGNOSIS — E559 Vitamin D deficiency, unspecified: Secondary | ICD-10-CM

## 2020-02-17 DIAGNOSIS — E1159 Type 2 diabetes mellitus with other circulatory complications: Secondary | ICD-10-CM | POA: Diagnosis not present

## 2020-02-17 DIAGNOSIS — E291 Testicular hypofunction: Secondary | ICD-10-CM | POA: Diagnosis not present

## 2020-02-17 DIAGNOSIS — Z7689 Persons encountering health services in other specified circumstances: Secondary | ICD-10-CM

## 2020-02-17 LAB — POCT GLYCOSYLATED HEMOGLOBIN (HGB A1C): HbA1c, POC (controlled diabetic range): 5.3 % (ref 0.0–7.0)

## 2020-02-17 MED ORDER — SAXENDA 18 MG/3ML ~~LOC~~ SOPN: 3.0000 mg | PEN_INJECTOR | Freq: Every day | SUBCUTANEOUS | 2 refills | Status: DC

## 2020-02-17 MED ORDER — VITAMIN D (ERGOCALCIFEROL) 1.25 MG (50000 UNIT) PO CAPS: 50000.0000 [IU] | ORAL_CAPSULE | ORAL | 0 refills | Status: DC

## 2020-02-17 MED ORDER — BD PEN NEEDLE SHORT U/F 31G X 8 MM MISC: 1.0000 | 3 refills | Status: DC

## 2020-02-17 NOTE — Patient Instructions (Signed)

## 2020-02-17 NOTE — Progress Notes (Signed)
02/17/2020, 11:20 AM  Endocrinology follow-up note  Subjective:    Patient ID: Aaron Hunt, male    DOB: 1975/04/17.  Aaron Hunt is being seen in follow-up after he was seen in consultation for history of pituitary microadenoma, management of currently controlled asymptomatic type 2 diabetes, hyperlipidemia, hypogonadism. PCP : Monico Blitz   Past Medical History:  Diagnosis Date  . Back pain   . MRSA (methicillin resistant staph aureus) culture positive   . Pituitary mass ([place])   . Pneumonia     Past Surgical History:  Procedure Laterality Date  . BACK SURGERY    . EYE SURGERY Bilateral   . finger surgery Right    littl;e finger x2  . FOOT SURGERY Right   . KNEE SURGERY Left     Social History   Socioeconomic History  . Marital status: Married    Spouse name: Not on file  . Number of children: Not on file  . Years of education: Not on file  . Highest education level: Not on file  Occupational History  . Not on file  Tobacco Use  . Smoking status: Former Smoker    Packs/day: 1.00    Years: 26.00    Pack years: 26.00    Types: Cigarettes    Quit date: 12/13/2014    Years since quitting: 5.1  . Smokeless tobacco: Former Network engineer  . Vaping Use: Never used  Substance and Sexual Activity  . Alcohol use: Not Currently    Comment: occasioanal  . Drug use: Never  . Sexual activity: Not on file  Other Topics Concern  . Not on file  Social History Narrative  . Not on file   Social Determinants of Health   Financial Resource Strain: Not on file  Food Insecurity: Not on file  Transportation Needs: Not on file  Physical Activity: Not on file  Stress: Not on file  Social Connections: Not on file    Family History  Problem Relation Age of Onset  . Cancer Mother   . Diabetes Mother   . Cancer Father   . Depression Father   . Drug abuse Father     Outpatient  Encounter Medications as of 02/17/2020  Medication Sig  . Insulin Pen Needle (B-D ULTRAFINE III SHORT PEN) 31G X 8 MM MISC 1 each by Does not apply route as directed.  . Liraglutide -Weight Management (SAXENDA) 18 MG/3ML SOPN Inject 3 mg into the skin daily with breakfast.  . Vitamin D, Ergocalciferol, (DRISDOL) 1.25 MG (50000 UNIT) CAPS capsule Take 1 capsule (50,000 Units total) by mouth every 7 (seven) days.  . cetirizine (ZYRTEC) 10 MG tablet Take 1 tablet by mouth daily.  . fluticasone (FLONASE) 50 MCG/ACT nasal spray Place 2 sprays into both nostrils daily as needed.   Marland Kitchen ibuprofen (ADVIL) 800 MG tablet Take 1 tablet by mouth 2 (two) times daily as needed.  . lamoTRIgine (LAMICTAL) 150 MG tablet Take 1 tablet (150 mg total) by mouth daily. (Patient not taking: Reported on 02/17/2020)  . metFORMIN (GLUCOPHAGE-XR) 500 MG 24 hr tablet Take 1 tablet (500 mg total)  by mouth daily after breakfast.  . rosuvastatin (CRESTOR) 10 MG tablet TAKE 1 TABLET(10 MG) BY MOUTH DAILY  . Testosterone 25 MG/2.5GM (1%) GEL Apply externally to specific area of the skin 3 pumps every day  . TRICOR 48 MG tablet Take 1 tablet by mouth daily.   No facility-administered encounter medications on file as of 02/17/2020.    ALLERGIES: No Known Allergies  VACCINATION STATUS:  There is no immunization history on file for this patient.  Diabetes He presents for his follow-up diabetic visit. He has type 2 diabetes mellitus. His disease course has been stable. There are no hypoglycemic associated symptoms. Pertinent negatives for hypoglycemia include no confusion, headaches, pallor or seizures. There are no diabetic associated symptoms. Pertinent negatives for diabetes include no chest pain, no fatigue, no polydipsia, no polyphagia, no polyuria and no weakness. There are no hypoglycemic complications. Symptoms are improving. Diabetic complications include PVD. Risk factors for coronary artery disease include diabetes  mellitus, dyslipidemia, male sex, obesity, tobacco exposure and sedentary lifestyle. Current diabetic treatment includes oral agent (monotherapy). He is compliant with treatment most of the time. His weight is stable. He is following a generally unhealthy diet. When asked about meal planning, he reported none. He participates in exercise intermittently. There is no change in his home blood glucose trend. (His point-of-care A1c is 5.4% consistent with well-controlled diabetes type 2.   ) An ACE inhibitor/angiotensin II receptor blocker is not being taken.  Hyperlipidemia This is a chronic problem. The current episode started more than 1 year ago. Associated symptoms include myalgias. Pertinent negatives include no chest pain or shortness of breath. Current antihyperlipidemic treatment includes fibric acid derivatives and statins. Risk factors for coronary artery disease include dyslipidemia, diabetes mellitus, male sex, obesity, a sedentary lifestyle and family history.   Hypogonadism: He was diagnosed with hypogonadism at approximate age of 37, has been on testosterone supplement intermittently for the last  6 years.  He is currently on 25 mg of testosterone gel daily, his previsit labs show total testosterone 265 dropping from 318 during his last visit.   Pituitary microadenoma: First discovered in 2016 at which time was measuring 8 x 8 x 10 mm-a total of 1 of 640 mm3,   complete endocrine evaluation was negative.  Repeat MRI in 2028 showed measurements dropping to 8 x 7 x 9 mm-total volume of 504 mm3.   Review of Systems  Constitutional: Negative for chills, fatigue, fever and unexpected weight change.  HENT: Negative for dental problem, mouth sores and trouble swallowing.   Eyes: Negative for visual disturbance.  Respiratory: Negative for cough, choking, chest tightness, shortness of breath and wheezing.   Cardiovascular: Negative for chest pain, palpitations and leg swelling.  Gastrointestinal:  Negative for abdominal distention, abdominal pain, constipation, diarrhea, nausea and vomiting.  Endocrine: Negative for polydipsia, polyphagia and polyuria.  Genitourinary: Negative for dysuria, flank pain, hematuria and urgency.  Musculoskeletal: Positive for myalgias. Negative for back pain, gait problem and neck pain.  Skin: Negative for pallor, rash and wound.  Neurological: Negative for seizures, syncope, weakness, numbness and headaches.  Psychiatric/Behavioral: Negative for confusion and dysphoric mood.    Objective:    Vitals with BMI 02/17/2020 10/31/2019 10/14/2019  Height _0  _1  5' 6.5"  Weight 291 lbs 282 lbs 273 lbs  BMI 46.99 15.83 09.40  Systolic 768 088 110  Diastolic 74 79 86  Pulse 68 79 91    BP 116/74   Pulse 68   Ht  _0  (1.676 m)   Wt 291 lb (132 kg)   BMI 46.97 kg/m   Wt Readings from Last 3 Encounters:  02/17/20 291 lb (132 kg)  10/31/19 282 lb (127.9 kg)  10/14/19 273 lb (123.8 kg)     Physical Exam Constitutional:      General: He is not in acute distress.    Appearance: He is well-developed.  HENT:     Head: Normocephalic and atraumatic.  Neck:     Thyroid: No thyromegaly.     Trachea: No tracheal deviation.  Cardiovascular:     Rate and Rhythm: Normal rate.     Pulses:          Dorsalis pedis pulses are 1+ on the right side and 1+ on the left side.       Posterior tibial pulses are 1+ on the right side and 1+ on the left side.     Heart sounds: Normal heart sounds, S1 normal and S2 normal. No murmur heard. No gallop.   Pulmonary:     Effort: No respiratory distress.     Breath sounds: Normal breath sounds. No wheezing.  Abdominal:     General: Bowel sounds are normal. There is no distension.     Palpations: Abdomen is soft.     Tenderness: There is no abdominal tenderness. There is no guarding.  Musculoskeletal:     Right shoulder: No swelling or deformity.     Cervical back: Normal range of motion and neck supple.  Skin:     General: Skin is warm and dry.     Findings: No rash.     Nails: There is no clubbing.     Comments: Genital exam is unremarkable with testicles 25 cc bilaterally clinical exam is unremarkable instead of bursitis 25/CC bilaterally noscrotalmass.  No inguinal or femoral hernia.  Circumcised male.  Neurological:     Mental Status: He is alert and oriented to person, place, and time.     Cranial Nerves: No cranial nerve deficit.     Sensory: No sensory deficit.     Gait: Gait normal.     Deep Tendon Reflexes: Reflexes are normal and symmetric.  Psychiatric:        Speech: Speech normal.        Behavior: Behavior normal. Behavior is cooperative.        Thought Content: Thought content normal.        Judgment: Judgment normal.     Diabetic Labs (most recent): Lab Results  Component Value Date   HGBA1C 5.3 02/17/2020   HGBA1C 5.3 11/16/2019   HGBA1C 5.3 10/14/2019   Recent Results (from the past 2160 hour(s))  Comprehensive metabolic panel     Status: Abnormal   Collection Time: 02/03/20  8:04 AM  Result Value Ref Range   Glucose 86 65 - 99 mg/dL   BUN 21 6 - 24 mg/dL   Creatinine, Ser 0.80 0.76 - 1.27 mg/dL   GFR calc non Af Amer 109 >59 mL/min/1.73   GFR calc Af Amer 126 >59 mL/min/1.73    Comment: **In accordance with recommendations from the NKF-ASN Task force,**   [facility] is in the process of updating its eGFR calculation to the   2021 CKD-EPI creatinine equation that estimates kidney function   without a race variable.    BUN/Creatinine Ratio 26 (H) 9 - 20   Sodium 139 134 - 144 mmol/L   Potassium 4.7 3.5 - 5.2 mmol/L   Chloride 105  96 - 106 mmol/L   CO2 19 (L) 20 - 29 mmol/L   Calcium 9.3 8.7 - 10.2 mg/dL   Total Protein 6.8 6.0 - 8.5 g/dL   Albumin 4.6 4.0 - 5.0 g/dL   Globulin, Total 2.2 1.5 - 4.5 g/dL   Albumin/Globulin Ratio 2.1 1.2 - 2.2   Bilirubin Total 0.3 0.0 - 1.2 mg/dL   Alkaline Phosphatase 59 44 - 121 IU/L   AST 23 0 - 40 IU/L   ALT 38 0 - 44 IU/L   Lipid panel     Status: None   Collection Time: 02/03/20  8:04 AM  Result Value Ref Range   Cholesterol, Total 159 100 - 199 mg/dL   Triglycerides 149 0 - 149 mg/dL   HDL 46 >39 mg/dL   VLDL Cholesterol Cal 26 5 - 40 mg/dL   LDL Chol Calc (NIH) 87 0 - 99 mg/dL   Chol/HDL Ratio 3.5 0.0 - 5.0 ratio    Comment:                                   T. Chol/HDL Ratio                                             Men  Women                               1/2 Avg.Risk  3.4    3.3                                   Avg.Risk  5.0    4.4                                2X Avg.Risk  9.6    7.1                                3X Avg.Risk 23.4   11.0   PSA     Status: None   Collection Time: 02/03/20  8:04 AM  Result Value Ref Range   Prostate Specific Ag, Serum 0.6 0.0 - 4.0 ng/mL    Comment: Roche ECLIA methodology. According to the American Urological Association, Serum PSA should decrease and remain at undetectable levels after radical prostatectomy. The AUA defines biochemical recurrence as an initial PSA value 0.2 ng/mL or greater followed by a subsequent confirmatory PSA value 0.2 ng/mL or greater. Values obtained with different assay methods or kits cannot be used interchangeably. Results cannot be interpreted as absolute evidence of the presence or absence of malignant disease.   Testosterone, Free, Total, SHBG     Status: None   Collection Time: 02/03/20  8:04 AM  Result Value Ref Range   Testosterone 265 264 - 916 ng/dL    Comment: Adult male reference interval is based on a population of healthy nonobese males (BMI <30) between 46 and 71 years old. [place], [place] [phone]. PMID: 17494496.    Testosterone, Free 15.8 6.8 - 21.5 pg/mL   Sex Hormone Binding 27.0 16.5 -  55.9 nmol/L  VITAMIN D 25 Hydroxy (Vit-D Deficiency, Fractures)     Status: Abnormal   Collection Time: 02/03/20  8:04 AM  Result Value Ref Range   Vit D, 25-Hydroxy 14.9 (L) 30.0 - 100.0 ng/mL     Comment: Vitamin D deficiency has been defined by the [place] practice guideline as a level of serum 25-OH vitamin D less than 20 ng/mL (1,2). The Endocrine Society went on to further define vitamin D insufficiency as a level between 21 and 29 ng/mL (2). 1. IOM (Institute of Medicine). 2010. Dietary reference    intakes for calcium and D. [place]: The    [employer]. 2. Holick MF, Binkley [place], Bischoff-Ferrari HA, et al.    Evaluation, treatment, and prevention of vitamin D    deficiency: an Endocrine Society clinical practice    guideline. JCEM. 2011 Jul; 96(7):1911-30.   HgB A1c     Status: None   Collection Time: 02/17/20  9:26 AM  Result Value Ref Range   Hemoglobin A1C     HbA1c POC (<> result, manual entry)     HbA1c, POC (prediabetic range)     HbA1c, POC (controlled diabetic range) 5.3 0.0 - 7.0 %    Pituitary microadenoma 2018 MRI: 8 x 7 x 9 mm   Assessment & Plan:   1. Benign neoplasm of pituitary gland (HCC) -2. Diabetes mellitus without complication ([place]) 3. Mixed hyperlipidemia 4. Hypogonadism, male   1)  Regarding his pituitary microadenoma: His repeat endocrine work-up is still consistent with nonfunctional adenoma.  -He will not need pituitary imaging studies at this time.     2) currently controlled type 2 diabetes, point-of-care A1c remains stable at 5.3%.         Recent labs reviewed.  He denies hypoglycemia.  he remains at a high risk for more acute and chronic complications which include CAD, CVA, CKD, retinopathy, and neuropathy. These are all discussed in detail with him.  - I have counseled him on diet  and weight management  by adopting a carbohydrate restricted/protein rich diet. Patient is encouraged to switch to  unprocessed or minimally processed     complex starch and increased protein intake (animal or plant source), fruits, and vegetables. -  he is advised to stick to a routine mealtimes  to eat 3 meals  a day and avoid unnecessary snacks ( to snack only to correct hypoglycemia).   - he acknowledges that there is a room for improvement in his food and drink choices. - Suggestion is made for him to avoid simple carbohydrates  from his diet including Cakes, Sweet Desserts, Ice Cream, Soda (diet and regular), Sweet Tea, Candies, Chips, Cookies, Store Bought Juices, Alcohol in Excess of  1-2 drinks a day, Artificial Sweeteners,  Coffee Creamer, and "Sugar-free" Products, Lemonade. This will help patient to have more stable blood glucose profile and potentially avoid unintended weight gain. -He would benefit from continued intervention with Metformin.  I discussed and continue Metformin 5 mg extended release daily after breakfast.  -He is interested to explore his next options of weight management. -He will benefit from [place].  I discussed and prescribed Saxenda starting 0.6 mg subcutaneously daily to advance by 0.6 mg weekly to a maximum of 3 mg p.o. daily on fifth week. -He is also given brochure and contact information for bariatric program at [facility].  He is a good candidate for bariatric weight management.  Saxenda injection technique  was reviewed and clinic for him.  - Specific targets for  A1c;  LDL, HDL,  and Triglycerides were discussed with the patient.  3) Blood Pressure /Hypertension: His blood pressure is controlled to target.  He is not on antihypertensive medications.     4) Lipids/Hyperlipidemia: His recent lipid panel showed LDL high at 116.  He was initiated on Crestor 10 mg p.o. nightly along with TriCor 48 mg p.o. daily.     He is advised to continue on these 2 medications.    Side effects and precautions discussed with him.  See below.   5)  Weight/Diet:  Body mass index is 46.97 kg/m.  -   clearly complicating his diabetes care.  Diet and exercise emphasized as above.  he is  A good  candidate for weight loss.  See above, information given about bariatric  surgery.    6) hypogonadism: I have reviewed his current and previous total testosterone measurements.  He did have testosterone as low as 66 in 2017.  Prior to his last visit, he did have fluctuating testosterone levels.  Most recent labs show total testosterone 265 dropping from 318.  There has been a change on his testosterone prescription, currently getting packets instead of the pump.  He wishes to be continued on testosterone treatment.  He is advised to increase his daily supplement to 50 mg  (2 packets of 25 mg) .    His genital exam is unremarkable including normal sized testicles.       He will have total testosterone, CBC during his next visit.  His CMP and PSA are normal at this time.  7) Chronic Care/Health Maintenance:  -he  is on not ACEI nor  Statin medications and  is encouraged to initiate and continue to follow up with Ophthalmology, Dentist,  Podiatrist at least yearly or according to recommendations, and advised to   stay away from smoking. I have recommended yearly flu vaccine and pneumonia vaccine at least every 5 years; moderate intensity exercise for up to 150 minutes weekly; and  sleep for at least 7 hours a day.     - he is  advised to maintain close follow up with his PCP for primary care needs, as well as his other providers for optimal and coordinated care.      - Time spent on this patient care encounter:  40 minutes of which 50% was spent in  counseling and the rest reviewing  his current and  previous labs / studies and medications  doses and developing a plan for long term care, and documenting this care. Doreene Burke  participated in the discussions, expressed understanding, and voiced agreement with the above plans.  All questions were answered to his satisfaction. he is encouraged to contact clinic should he have any questions or concerns prior to his return visit.    Follow up plan: - Return in about 4 months (around 06/16/2020) for Fasting Labs  in  AM B4 8.  Glade Lloyd, MD [facility] [address] [place], [place] [address] Phone: [phone]  Fax: [phone]    02/17/2020, 11:20 AM  This note was partially dictated with voice recognition software. Similar sounding words can be transcribed inadequately or may not  be corrected upon review.

## 2020-03-06 ENCOUNTER — Other Ambulatory Visit: Payer: Self-pay | Admitting: "Endocrinology

## 2020-03-19 ENCOUNTER — Other Ambulatory Visit: Payer: Self-pay

## 2020-03-19 ENCOUNTER — Other Ambulatory Visit: Payer: Self-pay | Admitting: "Endocrinology

## 2020-03-19 DIAGNOSIS — E291 Testicular hypofunction: Secondary | ICD-10-CM

## 2020-03-19 MED ORDER — TRICOR 48 MG PO TABS: 48.0000 mg | ORAL_TABLET | Freq: Every day | ORAL | 1 refills | Status: DC

## 2020-03-19 MED ORDER — TESTOSTERONE 20.25 MG/ACT (1.62%) TD GEL: TRANSDERMAL | 0 refills | Status: DC

## 2020-03-30 NOTE — Telephone Encounter (Signed)
April called from [place] called and is asking about this pt-she said they can not get him started in this program because they do not have any information from the [place]. She called the [place] and [facility]. Call back at [phone] direct line.

## 2020-04-04 NOTE — Progress Notes (Deleted)
BH MD/PA/NP OP Progress Note  04/04/2020 4:45 PM Aaron Hunt  MRN:  [id number]  Chief Complaint:  HPI:  - he was started on saxenda for weight loss  Visit Diagnosis: No diagnosis found.  Past Psychiatric History: Please see initial evaluation for full details. I have reviewed the history. No updates at this time.     Past Medical History:  Past Medical History:  Diagnosis Date  . Back pain   . MRSA (methicillin resistant staph aureus) culture positive   . Pituitary mass ([place])   . Pneumonia     Past Surgical History:  Procedure Laterality Date  . BACK SURGERY    . EYE SURGERY Bilateral   . finger surgery Right    littl;e finger x2  . FOOT SURGERY Right   . KNEE SURGERY Left     Family Psychiatric History: Please see initial evaluation for full details. I have reviewed the history. No updates at this time.     Family History:  Family History  Problem Relation Age of Onset  . Cancer Mother   . Diabetes Mother   . Cancer Father   . Depression Father   . Drug abuse Father     Social History:  Social History   Socioeconomic History  . Marital status: Married    Spouse name: Not on file  . Number of children: Not on file  . Years of education: Not on file  . Highest education level: Not on file  Occupational History  . Not on file  Tobacco Use  . Smoking status: Former Smoker    Packs/day: 1.00    Years: 26.00    Pack years: 26.00    Types: Cigarettes    Quit date: 12/13/2014    Years since quitting: 5.3  . Smokeless tobacco: Former Network engineer  . Vaping Use: Never used  Substance and Sexual Activity  . Alcohol use: Not Currently    Comment: occasioanal  . Drug use: Never  . Sexual activity: Not on file  Other Topics Concern  . Not on file  Social History Narrative  . Not on file   Social Determinants of Health   Financial Resource Strain: Not on file  Food Insecurity: Not on file  Transportation Needs: Not on file  Physical  Activity: Not on file  Stress: Not on file  Social Connections: Not on file    Allergies: No Known Allergies  Metabolic Disorder Labs: Lab Results  Component Value Date   HGBA1C 5.3 02/17/2020   Lab Results  Component Value Date   PROLACTIN 2.9 06/17/2019   Lab Results  Component Value Date   CHOL 159 02/03/2020   TRIG 149 02/03/2020   HDL 46 02/03/2020   CHOLHDL 3.5 02/03/2020   LDLCALC 87 02/03/2020   LDLCALC 116 (H) 06/17/2019   Lab Results  Component Value Date   TSH 1.50 06/17/2019    Therapeutic Level Labs: No results found for: LITHIUM No results found for: VALPROATE No components found for:  CBMZ  Current Medications: Current Outpatient Medications  Medication Sig Dispense Refill  . cetirizine (ZYRTEC) 10 MG tablet Take 1 tablet by mouth daily.    . fluticasone (FLONASE) 50 MCG/ACT nasal spray Place 2 sprays into both nostrils daily as needed.     Marland Kitchen ibuprofen (ADVIL) 800 MG tablet Take 1 tablet by mouth 2 (two) times daily as needed.    . Insulin Pen Needle (B-D ULTRAFINE III SHORT PEN) 31G X 8 MM MISC  1 each by Does not apply route as directed. 100 each 3  . lamoTRIgine (LAMICTAL) 150 MG tablet Take 1 tablet (150 mg total) by mouth daily. (Patient not taking: Reported on 02/17/2020) 90 tablet 0  . Liraglutide -Weight Management (SAXENDA) 18 MG/3ML SOPN Inject 3 mg into the skin daily with breakfast. 15 mL 2  . metFORMIN (GLUCOPHAGE-XR) 500 MG 24 hr tablet Take 1 tablet (500 mg total) by mouth daily after breakfast. 90 tablet 1  . rosuvastatin (CRESTOR) 10 MG tablet TAKE 1 TABLET(10 MG) BY MOUTH DAILY 90 tablet 1  . Testosterone 20.25 MG/ACT (1.62%) GEL Apply 20.25mg /ACT on each shoulder every morning- for a total of 40.5mg  daily. 75 g 0  . TRICOR 48 MG tablet Take 1 tablet (48 mg total) by mouth daily. 90 tablet 1  . Vitamin D, Ergocalciferol, (DRISDOL) 1.25 MG (50000 UNIT) CAPS capsule Take 1 capsule (50,000 Units total) by mouth every 7 (seven) days. 12  capsule 0   No current facility-administered medications for this visit.     Musculoskeletal: Strength & Muscle Tone: N/A Gait & Station: N/A Patient leans: N/A  Psychiatric Specialty Exam: Review of Systems  There were no vitals taken for this visit.There is no height or weight on file to calculate BMI.  General Appearance: {Appearance:22683}  Eye Contact:  {BHH EYE CONTACT:22684}  Speech:  Clear and Coherent  Volume:  Normal  Mood:  {BHH MOOD:22306}  Affect:  {Affect (PAA):22687}  Thought Process:  Coherent  Orientation:  Full (Time, Place, and Person)  Thought Content: Logical   Suicidal Thoughts:  {ST/HT (PAA):22692}  Homicidal Thoughts:  {ST/HT (PAA):22692}  Memory:  Immediate;   Good  Judgement:  {Judgement (PAA):22694}  Insight:  {Insight (PAA):22695}  Psychomotor Activity:  Normal  Concentration:  Concentration: Good and Attention Span: Good  Recall:  Good  Fund of Knowledge: Good  Language: Good  Akathisia:  No  Handed:  Right  AIMS (if indicated): not done  Assets:  Communication Skills Desire for Improvement  ADL's:  Intact  Cognition: WNL  Sleep:  {BHH GOOD/FAIR/POOR:22877}   Screenings:   Assessment and Plan:  Aaron Hunt is a 45 y.o. year old male with a history of GAD, mood disorder, r/o PTSD, non functionalpituitary microadenoma,type 2 diabetes, hyperlipidemia, hypogonadism, who presents for follow up appointment for below.   1. MDD (major depressive disorder), recurrent, in partial remission ([place]) 2. PTSD (post-traumatic stress disorder) There has been significant improvement in irritability since up titration of lamotrigine.  Will continue current dose of lamotrigine for irritability.  Discussed potential risk of Stevens-Johnson syndrome.   # Marijuana use He is comfortable continuing marijuana use despite psycho education on its potential long-term adverse reaction.  We will continue motivational interview.   Plan 1.Continue   lamotrigine 150 mg daily  3.Next appointment-4/5 at 8:40 for 20 mins,video   The patient demonstrates the following risk factors for suicide: Chronic risk factors for suicide include:psychiatric disorder ofdepression, PTSD. Acute risk factorsfor suicide include:N/A. Protective factorsfor this patient include: responsibility to others (children, family), coping skills and hope for the future. Considering these factors, the overall suicide risk at this point appears to below. Patientisappropriate for outpatient follow up.  [name], MD 04/04/2020, 4:45 PM

## 2020-04-10 ENCOUNTER — Other Ambulatory Visit: Payer: Self-pay

## 2020-04-10 ENCOUNTER — Telehealth: Payer: Self-pay | Admitting: Psychiatry

## 2020-04-10 ENCOUNTER — Telehealth: Admitting: Psychiatry

## 2020-04-10 NOTE — Telephone Encounter (Signed)
Sent link for video visit through [place]. Patient did not sign in. Called the patient for appointment scheduled today. The patient answered the phone, and stated that he meant to cancel this appointment, although he could not talk with the staff. He agreed to cancel this appointment. He will contact the office for rescheduling.

## 2020-05-09 ENCOUNTER — Other Ambulatory Visit: Payer: Self-pay | Admitting: "Endocrinology

## 2020-05-09 MED ORDER — FENOFIBRATE 67 MG PO CAPS: 67.0000 mg | ORAL_CAPSULE | Freq: Every day | ORAL | 1 refills | Status: DC

## 2020-05-09 MED ORDER — TESTOSTERONE 20.25 MG/ACT (1.62%) TD GEL: TRANSDERMAL | 0 refills | Status: DC

## 2020-05-24 ENCOUNTER — Other Ambulatory Visit: Payer: Self-pay | Admitting: "Endocrinology

## 2020-06-10 LAB — CBC WITH DIFFERENTIAL/PLATELET
Basophils Absolute: 0.1 10*3/uL (ref 0.0–0.2)
Basos: 2 %
EOS (ABSOLUTE): 0.4 10*3/uL (ref 0.0–0.4)
Eos: 6 %
Hematocrit: 44.7 % (ref 37.5–51.0)
Hemoglobin: 15.1 g/dL (ref 13.0–17.7)
Immature Grans (Abs): 0 10*3/uL (ref 0.0–0.1)
Immature Granulocytes: 1 %
Lymphocytes Absolute: 2.1 10*3/uL (ref 0.7–3.1)
Lymphs: 34 %
MCH: 28.2 pg (ref 26.6–33.0)
MCHC: 33.8 g/dL (ref 31.5–35.7)
MCV: 83 fL (ref 79–97)
Monocytes Absolute: 0.6 10*3/uL (ref 0.1–0.9)
Monocytes: 9 %
Neutrophils Absolute: 3.1 10*3/uL (ref 1.4–7.0)
Neutrophils: 48 %
Platelets: 240 10*3/uL (ref 150–450)
RBC: 5.36 x10E6/uL (ref 4.14–5.80)
RDW: 13 % (ref 11.6–15.4)
WBC: 6.3 10*3/uL (ref 3.4–10.8)

## 2020-06-10 LAB — TESTOSTERONE, FREE, TOTAL, SHBG
Sex Hormone Binding: 23.4 nmol/L (ref 16.5–55.9)
Testosterone, Free: 9.2 pg/mL (ref 6.8–21.5)
Testosterone: 169 ng/dL — ABNORMAL LOW (ref 264–916)

## 2020-06-22 ENCOUNTER — Other Ambulatory Visit: Payer: Self-pay

## 2020-06-22 ENCOUNTER — Ambulatory Visit (INDEPENDENT_AMBULATORY_CARE_PROVIDER_SITE_OTHER): Admitting: "Endocrinology

## 2020-06-22 ENCOUNTER — Encounter: Payer: Self-pay | Admitting: "Endocrinology

## 2020-06-22 VITALS — BP 121/78 | HR 68 | Ht 66.0 in | Wt 280.8 lb

## 2020-06-22 DIAGNOSIS — E291 Testicular hypofunction: Secondary | ICD-10-CM

## 2020-06-22 DIAGNOSIS — E1159 Type 2 diabetes mellitus with other circulatory complications: Secondary | ICD-10-CM

## 2020-06-22 LAB — POCT GLYCOSYLATED HEMOGLOBIN (HGB A1C): HbA1c, POC (controlled diabetic range): 5.3 % (ref 0.0–7.0)

## 2020-06-22 MED ORDER — FENOFIBRATE 67 MG PO CAPS: 67.0000 mg | ORAL_CAPSULE | Freq: Every day | ORAL | 1 refills | Status: AC

## 2020-06-22 MED ORDER — ROSUVASTATIN CALCIUM 10 MG PO TABS: 10.0000 mg | ORAL_TABLET | Freq: Every day | ORAL | 1 refills | Status: DC

## 2020-06-22 MED ORDER — METFORMIN HCL ER 500 MG PO TB24: 500.0000 mg | ORAL_TABLET | Freq: Every day | ORAL | 1 refills | Status: DC

## 2020-06-22 MED ORDER — VITAMIN D3 125 MCG (5000 UT) PO CAPS: 5000.0000 [IU] | ORAL_CAPSULE | Freq: Every day | ORAL | 0 refills | Status: DC

## 2020-06-22 NOTE — Patient Instructions (Signed)

## 2020-06-22 NOTE — Progress Notes (Signed)
06/22/2020, 10:59 AM  Endocrinology follow-up note  Subjective:    Patient ID: Aaron Hunt, male    DOB: 11-26-1975.  Aaron Hunt is being seen in follow-up after he was seen in consultation for history of pituitary microadenoma, management of currently controlled asymptomatic type 2 diabetes, hyperlipidemia, hypogonadism. PCP : Monico Blitz   Past Medical History:  Diagnosis Date   Back pain    MRSA (methicillin resistant staph aureus) culture positive    Pituitary mass ([place])    Pneumonia     Past Surgical History:  Procedure Laterality Date   BACK SURGERY     EYE SURGERY Bilateral    finger surgery Right    littl;e finger x2   FOOT SURGERY Right    KNEE SURGERY Left     Social History   Socioeconomic History   Marital status: Married    Spouse name: Not on file   Number of children: Not on file   Years of education: Not on file   Highest education level: Not on file  Occupational History   Not on file  Tobacco Use   Smoking status: Former    Packs/day: 1.00    Years: 26.00    Pack years: 26.00    Types: Cigarettes    Quit date: 12/13/2014    Years since quitting: 5.5   Smokeless tobacco: Former  Scientific laboratory technician Use: Never used  Substance and Sexual Activity   Alcohol use: Not Currently    Comment: occasioanal   Drug use: Never   Sexual activity: Not on file  Other Topics Concern   Not on file  Social History Narrative   Not on file   Social Determinants of Health   Financial Resource Strain: Not on file  Food Insecurity: Not on file  Transportation Needs: Not on file  Physical Activity: Not on file  Stress: Not on file  Social Connections: Not on file    Family History  Problem Relation Age of Onset   Cancer Mother    Diabetes Mother    Cancer Father    Depression Father    Drug abuse Father     Outpatient Encounter Medications as of 06/22/2020   Medication Sig   Cholecalciferol (VITAMIN D3) 125 MCG (5000 UT) CAPS Take 1 capsule (5,000 Units total) by mouth daily.   cetirizine (ZYRTEC) 10 MG tablet Take 1 tablet by mouth daily.   fenofibrate micronized (LOFIBRA) 67 MG capsule Take 1 capsule (67 mg total) by mouth daily before breakfast.   fluticasone (FLONASE) 50 MCG/ACT nasal spray Place 2 sprays into both nostrils daily as needed.    ibuprofen (ADVIL) 800 MG tablet Take 1 tablet by mouth 2 (two) times daily as needed.   metFORMIN (GLUCOPHAGE-XR) 500 MG 24 hr tablet Take 1 tablet (500 mg total) by mouth daily with breakfast.   rosuvastatin (CRESTOR) 10 MG tablet Take 1 tablet (10 mg total) by mouth daily.   Testosterone 20.25 MG/ACT (1.62%) GEL Apply 20.25mg /ACT on each shoulder every morning- for a total of 40.5mg  daily.   [DISCONTINUED] fenofibrate micronized (LOFIBRA) 67 MG capsule Take 1  capsule (67 mg total) by mouth daily before breakfast.   [DISCONTINUED] Insulin Pen Needle (B-D ULTRAFINE III SHORT PEN) 31G X 8 MM MISC 1 each by Does not apply route as directed.   [DISCONTINUED] lamoTRIgine (LAMICTAL) 150 MG tablet Take 1 tablet (150 mg total) by mouth daily. (Patient not taking: Reported on 02/17/2020)   [DISCONTINUED] Liraglutide -Weight Management (SAXENDA) 18 MG/3ML SOPN Inject 3 mg into the skin daily with breakfast.   [DISCONTINUED] metFORMIN (GLUCOPHAGE-XR) 500 MG 24 hr tablet TAKE 1 TABLET(500 MG) BY MOUTH DAILY AFTER BREAKFAST   [DISCONTINUED] rosuvastatin (CRESTOR) 10 MG tablet TAKE 1 TABLET(10 MG) BY MOUTH DAILY   [DISCONTINUED] Vitamin D, Ergocalciferol, (DRISDOL) 1.25 MG (50000 UNIT) CAPS capsule TAKE 1 CAPSULE BY MOUTH EVERY 7 DAYS   No facility-administered encounter medications on file as of 06/22/2020.    ALLERGIES: No Known Allergies  VACCINATION STATUS:  There is no immunization history on file for this patient.  Diabetes He presents for his follow-up diabetic visit. He has type 2 diabetes mellitus. His  disease course has been improving. There are no hypoglycemic associated symptoms. Pertinent negatives for hypoglycemia include no confusion, headaches, pallor or seizures. There are no diabetic associated symptoms. Pertinent negatives for diabetes include no chest pain, no fatigue, no polydipsia, no polyphagia, no polyuria and no weakness. There are no hypoglycemic complications. Symptoms are improving. Diabetic complications include PVD. Risk factors for coronary artery disease include diabetes mellitus, dyslipidemia, male sex, obesity, tobacco exposure and sedentary lifestyle. Current diabetic treatment includes oral agent (monotherapy). He is compliant with treatment most of the time. His weight is decreasing steadily. He is following a generally unhealthy diet. When asked about meal planning, he reported none. He participates in exercise intermittently. There is no change in his home blood glucose trend. (His point-of-care A1c of 5.3% consistent with controlled type 2 diabetes.    ) An ACE inhibitor/angiotensin II receptor blocker is not being taken.  Hyperlipidemia This is a chronic problem. The current episode started more than 1 year ago. Associated symptoms include myalgias. Pertinent negatives include no chest pain or shortness of breath. Current antihyperlipidemic treatment includes fibric acid derivatives and statins. Risk factors for coronary artery disease include dyslipidemia, diabetes mellitus, male sex, obesity, a sedentary lifestyle and family history.  Hypogonadism: He was diagnosed with hypogonadism at approximate age of 45, has been on testosterone supplement intermittently for the last  7 years.   He was supposed to be on 50 mg of testosterone gel daily, reports inconsistency using his testosterone.  He is previsit total testosterone is dropping to 169.    Pituitary microadenoma: First discovered in 2016 at which time was measuring 8 x 8 x 10 mm-a total of 1 of 640 mm3,   complete  endocrine evaluation was negative.  Repeat MRI in 2028 showed measurements dropping to 8 x 7 x 9 mm-total volume of 504 mm3.   Review of Systems  Constitutional:  Negative for chills, fatigue, fever and unexpected weight change.  HENT:  Negative for dental problem, mouth sores and trouble swallowing.   Eyes:  Negative for visual disturbance.  Respiratory:  Negative for cough, choking, chest tightness, shortness of breath and wheezing.   Cardiovascular:  Negative for chest pain, palpitations and leg swelling.  Gastrointestinal:  Negative for abdominal distention, abdominal pain, constipation, diarrhea, nausea and vomiting.  Endocrine: Negative for polydipsia, polyphagia and polyuria.  Genitourinary:  Negative for dysuria, flank pain, hematuria and urgency.  Musculoskeletal:  Positive for myalgias. Negative  for back pain, gait problem and neck pain.  Skin:  Negative for pallor, rash and wound.  Neurological:  Negative for seizures, syncope, weakness, numbness and headaches.  Psychiatric/Behavioral:  Negative for confusion and dysphoric mood.    Objective:    Vitals with BMI 06/22/2020 02/17/2020 10/31/2019  Height 5\' 6"  5\' 6"  5\' 6"   Weight 280 lbs 13 oz 291 lbs 282 lbs  BMI 45.34 82.50 53.97  Systolic 673 419 379  Diastolic 78 74 79  Pulse 68 68 79    BP 121/78   Pulse 68   Ht 5\' 6"  (1.676 m)   Wt 280 lb 12.8 oz (127.4 kg)   BMI 45.32 kg/m   Wt Readings from Last 3 Encounters:  06/22/20 280 lb 12.8 oz (127.4 kg)  02/17/20 291 lb (132 kg)  10/31/19 282 lb (127.9 kg)     Physical Exam Constitutional:      General: He is not in acute distress.    Appearance: He is well-developed.  HENT:     Head: Normocephalic and atraumatic.  Neck:     Thyroid: No thyromegaly.     Trachea: No tracheal deviation.  Cardiovascular:     Rate and Rhythm: Normal rate.     Pulses:          Dorsalis pedis pulses are 1+ on the right side and 1+ on the left side.       Posterior tibial pulses are  1+ on the right side and 1+ on the left side.     Heart sounds: Normal heart sounds, S1 normal and S2 normal. No murmur heard.   No gallop.  Pulmonary:     Effort: No respiratory distress.     Breath sounds: Normal breath sounds. No wheezing.  Abdominal:     General: Bowel sounds are normal. There is no distension.     Palpations: Abdomen is soft.     Tenderness: There is no abdominal tenderness. There is no guarding.  Musculoskeletal:     Right shoulder: No swelling or deformity.     Cervical back: Normal range of motion and neck supple.  Skin:    General: Skin is warm and dry.     Findings: No rash.     Nails: There is no clubbing.     Comments: Genital exam is unremarkable with testicles 25 cc bilaterally clinical exam is unremarkable instead of bursitis 25/CC bilaterally noscrotalmass.  No inguinal or femoral hernia.  Circumcised male.  Neurological:     Mental Status: He is alert and oriented to person, place, and time.     Cranial Nerves: No cranial nerve deficit.     Sensory: No sensory deficit.     Gait: Gait normal.     Deep Tendon Reflexes: Reflexes are normal and symmetric.  Psychiatric:        Speech: Speech normal.        Behavior: Behavior normal. Behavior is cooperative.        Thought Content: Thought content normal.        Judgment: Judgment normal.    Diabetic Labs (most recent): Lab Results  Component Value Date   HGBA1C 5.3 06/22/2020   HGBA1C 5.3 02/17/2020   HGBA1C 5.3 11/16/2019   Recent Results (from the past 2160 hour(s))  Testosterone, Free, Total, SHBG     Status: Abnormal   Collection Time: 06/08/20  8:03 AM  Result Value Ref Range   Testosterone 169 (L) 264 - 916 ng/dL  Comment: Adult male reference interval is based on a population of healthy nonobese males (BMI <30) between 46 and 25 years old. [place], [place] [phone]. PMID: [id number].    Testosterone, Free 9.2 6.8 - 21.5 pg/mL   Sex Hormone Binding 23.4 16.5 - 55.9  nmol/L  CBC with Differential/Platelet     Status: None   Collection Time: 06/08/20  8:03 AM  Result Value Ref Range   WBC 6.3 3.4 - 10.8 x10E3/uL   RBC 5.36 4.14 - 5.80 x10E6/uL   Hemoglobin 15.1 13.0 - 17.7 g/dL   Hematocrit 44.7 37.5 - 51.0 %   MCV 83 79 - 97 fL   MCH 28.2 26.6 - 33.0 pg   MCHC 33.8 31.5 - 35.7 g/dL   RDW 13.0 11.6 - 15.4 %   Platelets 240 150 - 450 x10E3/uL   Neutrophils 48 Not Estab. %   Lymphs 34 Not Estab. %   Monocytes 9 Not Estab. %   Eos 6 Not Estab. %   Basos 2 Not Estab. %   Neutrophils Absolute 3.1 1.4 - 7.0 x10E3/uL   Lymphocytes Absolute 2.1 0.7 - 3.1 x10E3/uL   Monocytes Absolute 0.6 0.1 - 0.9 x10E3/uL   EOS (ABSOLUTE) 0.4 0.0 - 0.4 x10E3/uL   Basophils Absolute 0.1 0.0 - 0.2 x10E3/uL   Immature Granulocytes 1 Not Estab. %   Immature Grans (Abs) 0.0 0.0 - 0.1 x10E3/uL  HgB A1c     Status: None   Collection Time: 06/22/20  9:42 AM  Result Value Ref Range   Hemoglobin A1C     HbA1c POC (<> result, manual entry)     HbA1c, POC (prediabetic range)     HbA1c, POC (controlled diabetic range) 5.3 0.0 - 7.0 %    Pituitary microadenoma 2018 MRI: 8 x 7 x 9 mm   Assessment & Plan:   1. Benign neoplasm of pituitary gland (HCC) -2. Diabetes mellitus without complication ([place]) 3. Mixed hyperlipidemia 4. Hypogonadism, male   1)  Regarding his pituitary microadenoma: His repeat endocrine work-up is consistent with nonfunctioning posterior adenoma.  He will not need pituitary imaging or intervention at this time    2) currently controlled type 2 diabetes, point-of-care A1c remains stable at 5.3%.  He does not monitor blood glucose, denies hypoglycemia.  He is advised to continue metformin 500 mg p.o. daily after breakfast.     He does not report gross complications from diabetes, however   he remains at a high risk for more acute and chronic complications which include CAD, CVA, CKD, retinopathy, and neuropathy. These are all discussed in detail with  him.  - I have counseled him on diet  and weight management  by adopting a carbohydrate restricted/protein rich diet. Patient is encouraged to switch to  unprocessed or minimally processed     complex starch and increased protein intake (animal or plant source), fruits, and vegetables.  Is educated on plant predominant, whole foods lifestyle nutrition. -  he is advised to stick to a routine mealtimes to eat 3 meals  a day and avoid unnecessary snacks ( to snack only to correct hypoglycemia).   - he acknowledges that there is a room for improvement in his food and drink choices. - Suggestion is made for him to avoid simple carbohydrates  from his diet including Cakes, Sweet Desserts, Ice Cream, Soda (diet and regular), Sweet Tea, Candies, Chips, Cookies, Store Bought Juices, Alcohol in Excess of  1-2 drinks a day, Artificial Sweeteners,  Coffee  Creamer, and "Sugar-free" Products, Lemonade. This will help patient to have more stable blood glucose profile and potentially avoid unintended weight gain.  -His insurance did not pay for Saxenda.  His bariatric surgery process is stalled.   - Specific targets for  A1c;  LDL, HDL,  and Triglycerides were discussed with the patient.  3) Blood Pressure /Hypertension:  His blood pressure is controlled.  He is not on antihypertensive medications.    4) Lipids/Hyperlipidemia: His recent lipid panel showed LDL high at 116.  He is tolerating Crestor, advised to continue Crestor 10 mg p.o. nightly along with his Tricor 48 mg p.o. daily.     Side effects and precautions discussed with him.     5)  Weight/Diet:  Body mass index is 45.32 kg/m.  -   clearly complicating his diabetes care.  Diet and exercise emphasized as above.  he is  A good  candidate for weight loss.  See above, information given about bariatric surgery.    6) hypogonadism: I have reviewed his current and previous total testosterone measurements.  He did have testosterone as low as 66 in  2017.  His previsit total testosterone dropping to 169 from 265.  This is mainly due to his inconsistency taking his testosterone gel.  He is advised to be consistent applying his testosterone 20.25 mg per ACT on each shoulder every morning for a total of 40.5 mg daily.       His genital exam is unremarkable including normal sized testicles.       He will have total testosterone, CBC during his next visit.  His CMP and PSA are normal at this time.  7) Chronic Care/Health Maintenance:  -he  is on not ACEI nor  Statin medications and  is encouraged to initiate and continue to follow up with Ophthalmology, Dentist,  Podiatrist at least yearly or according to recommendations, and advised to   stay away from smoking. I have recommended yearly flu vaccine and pneumonia vaccine at least every 5 years; moderate intensity exercise for up to 150 minutes weekly; and  sleep for at least 7 hours a day.     - he is  advised to maintain close follow up with his PCP for primary care needs, as well as his other providers for optimal and coordinated care.   I spent 31 minutes in the care of the patient today including review of labs from [place], Lipids, Thyroid Function, Hematology (current and previous including abstractions from other facilities); face-to-face time discussing  his blood glucose readings/logs, discussing hypoglycemia and hyperglycemia episodes and symptoms, medications doses, his options of short and long term treatment based on the latest standards of care / guidelines;  discussion about incorporating lifestyle medicine;  and documenting the encounter.    Please refer to Patient Instructions for Blood Glucose Monitoring and Insulin/Medications Dosing Guide"  in media tab for additional information. Please  also refer to " Patient Self Inventory" in the Media  tab for reviewed elements of pertinent patient history.  Doreene Burke participated in the discussions, expressed understanding, and  voiced agreement with the above plans.  All questions were answered to his satisfaction. he is encouraged to contact clinic should he have any questions or concerns prior to his return visit.    Follow up plan: - Return in about 4 months (around 10/22/2020) for Fasting Labs  in AM B4 8, A1c -NV, Urine MA - NV.  Aaron Lloyd, MD [facility]  [address] [place], [place] [address] Phone: [phone]  Fax: [phone]    06/22/2020, 10:59 AM  This note was partially dictated with voice recognition software. Similar sounding words can be transcribed inadequately or may not  be corrected upon review.

## 2020-07-03 ENCOUNTER — Other Ambulatory Visit: Payer: Self-pay | Admitting: "Endocrinology

## 2020-08-03 ENCOUNTER — Other Ambulatory Visit: Payer: Self-pay | Admitting: "Endocrinology

## 2020-10-06 ENCOUNTER — Other Ambulatory Visit: Payer: Self-pay | Admitting: "Endocrinology

## 2020-10-08 ENCOUNTER — Other Ambulatory Visit: Payer: Self-pay | Admitting: "Endocrinology

## 2020-10-08 NOTE — Telephone Encounter (Signed)
Controlled Substance  Last OV 06/22/2020  Next OV 10/25/2020  Last filled 08/03/2020 75 g with 0 refills

## 2020-10-16 LAB — LIPID PANEL
Chol/HDL Ratio: 3.1 ratio (ref 0.0–5.0)
Cholesterol, Total: 116 mg/dL (ref 100–199)
HDL: 37 mg/dL — ABNORMAL LOW (ref >39)
LDL Chol Calc (NIH): 63 mg/dL (ref 0–99)
Triglycerides: 81 mg/dL (ref 0–149)
VLDL Cholesterol Cal: 16 mg/dL (ref 5–40)

## 2020-10-16 LAB — COMPREHENSIVE METABOLIC PANEL
ALT: 39 IU/L (ref 0–44)
AST: 24 IU/L (ref 0–40)
Albumin/Globulin Ratio: 2 (ref 1.2–2.2)
Albumin: 4.7 g/dL (ref 4.0–5.0)
Alkaline Phosphatase: 66 IU/L (ref 44–121)
BUN/Creatinine Ratio: 19 (ref 9–20)
BUN: 15 mg/dL (ref 6–24)
Bilirubin Total: 0.3 mg/dL (ref 0.0–1.2)
CO2: 20 mmol/L (ref 20–29)
Calcium: 9.2 mg/dL (ref 8.7–10.2)
Chloride: 106 mmol/L (ref 96–106)
Creatinine, Ser: 0.79 mg/dL (ref 0.76–1.27)
Globulin, Total: 2.3 g/dL (ref 1.5–4.5)
Glucose: 97 mg/dL (ref 70–99)
Potassium: 4.6 mmol/L (ref 3.5–5.2)
Sodium: 142 mmol/L (ref 134–144)
Total Protein: 7 g/dL (ref 6.0–8.5)
eGFR: 113 mL/min/{1.73_m2} (ref >59)

## 2020-10-16 LAB — TESTOSTERONE, FREE, TOTAL, SHBG
Sex Hormone Binding: 25.7 nmol/L (ref 16.5–55.9)
Testosterone, Free: 7.5 pg/mL (ref 6.8–21.5)
Testosterone: 287 ng/dL (ref 264–916)

## 2020-10-16 LAB — PSA: Prostate Specific Ag, Serum: 0.5 ng/mL (ref 0.0–4.0)

## 2020-10-25 ENCOUNTER — Ambulatory Visit: Admitting: "Endocrinology

## 2020-10-26 ENCOUNTER — Ambulatory Visit (INDEPENDENT_AMBULATORY_CARE_PROVIDER_SITE_OTHER): Admitting: "Endocrinology

## 2020-10-26 ENCOUNTER — Encounter: Payer: Self-pay | Admitting: "Endocrinology

## 2020-10-26 ENCOUNTER — Other Ambulatory Visit: Payer: Self-pay

## 2020-10-26 VITALS — BP 126/78 | HR 72 | Ht 66.0 in | Wt 298.2 lb

## 2020-10-26 DIAGNOSIS — E559 Vitamin D deficiency, unspecified: Secondary | ICD-10-CM

## 2020-10-26 DIAGNOSIS — E782 Mixed hyperlipidemia: Secondary | ICD-10-CM

## 2020-10-26 DIAGNOSIS — E1159 Type 2 diabetes mellitus with other circulatory complications: Secondary | ICD-10-CM | POA: Diagnosis not present

## 2020-10-26 DIAGNOSIS — E291 Testicular hypofunction: Secondary | ICD-10-CM | POA: Diagnosis not present

## 2020-10-26 LAB — TSH: TSH: 5.7 (ref 0.41–5.90)

## 2020-10-26 MED ORDER — VITAMIN D3 125 MCG (5000 UT) PO CAPS: 5000.0000 [IU] | ORAL_CAPSULE | Freq: Every day | ORAL | 1 refills | Status: AC

## 2020-10-26 NOTE — Patient Instructions (Signed)

## 2020-10-26 NOTE — Progress Notes (Signed)
10/26/2020, 8:57 AM  Endocrinology follow-up note  Subjective:    Patient ID: Aaron Hunt, male    DOB: 1975/01/25.  Aaron Hunt is being seen in follow-up after he was seen in consultation for history of pituitary microadenoma, management of currently controlled asymptomatic type 2 diabetes, hyperlipidemia, hypogonadism. PCP : Monico Blitz   Past Medical History:  Diagnosis Date   Back pain    MRSA (methicillin resistant staph aureus) culture positive    Pituitary mass ([place])    Pneumonia     Past Surgical History:  Procedure Laterality Date   BACK SURGERY     EYE SURGERY Bilateral    finger surgery Right    littl;e finger x2   FOOT SURGERY Right    KNEE SURGERY Left     Social History   Socioeconomic History   Marital status: Married    Spouse name: Not on file   Number of children: Not on file   Years of education: Not on file   Highest education level: Not on file  Occupational History   Not on file  Tobacco Use   Smoking status: Former    Packs/day: 1.00    Years: 26.00    Pack years: 26.00    Types: Cigarettes    Quit date: 12/13/2014    Years since quitting: 5.8   Smokeless tobacco: Former  Scientific laboratory technician Use: Never used  Substance and Sexual Activity   Alcohol use: Not Currently    Comment: occasioanal   Drug use: Never   Sexual activity: Not on file  Other Topics Concern   Not on file  Social History Narrative   Not on file   Social Determinants of Health   Financial Resource Strain: Not on file  Food Insecurity: Not on file  Transportation Needs: Not on file  Physical Activity: Not on file  Stress: Not on file  Social Connections: Not on file    Family History  Problem Relation Age of Onset   Cancer Mother    Diabetes Mother    Cancer Father    Depression Father    Drug abuse Father     Outpatient Encounter Medications as of 10/26/2020   Medication Sig   cetirizine (ZYRTEC) 10 MG tablet Take 1 tablet by mouth daily.   Cholecalciferol (VITAMIN D3) 125 MCG (5000 UT) CAPS Take 1 capsule (5,000 Units total) by mouth daily.   fenofibrate micronized (LOFIBRA) 67 MG capsule Take 1 capsule (67 mg total) by mouth daily before breakfast.   fluticasone (FLONASE) 50 MCG/ACT nasal spray Place 2 sprays into both nostrils daily as needed.    ibuprofen (ADVIL) 800 MG tablet Take 1 tablet by mouth 2 (two) times daily as needed.   metFORMIN (GLUCOPHAGE-XR) 500 MG 24 hr tablet Take 1 tablet (500 mg total) by mouth daily with breakfast.   rosuvastatin (CRESTOR) 10 MG tablet Take 1 tablet (10 mg total) by mouth daily.   Testosterone 20.25 MG/ACT (1.62%) GEL APPLY 1 PUMP TO EACH SHOULDER DAILY   [DISCONTINUED] Cholecalciferol (VITAMIN D3) 125 MCG (5000 UT) CAPS Take 1 capsule (5,000 Units total)  by mouth daily. (Patient not taking: Reported on 10/26/2020)   No facility-administered encounter medications on file as of 10/26/2020.    ALLERGIES: No Known Allergies  VACCINATION STATUS:  There is no immunization history on file for this patient.  Diabetes He presents for his follow-up diabetic visit. He has type 2 diabetes mellitus. His disease course has been stable. There are no hypoglycemic associated symptoms. Pertinent negatives for hypoglycemia include no confusion, headaches, pallor or seizures. There are no diabetic associated symptoms. Pertinent negatives for diabetes include no chest pain, no fatigue, no polydipsia, no polyphagia, no polyuria and no weakness. There are no hypoglycemic complications. Symptoms are stable. Diabetic complications include PVD. Risk factors for coronary artery disease include diabetes mellitus, dyslipidemia, male sex, obesity, tobacco exposure and sedentary lifestyle. Current diabetic treatment includes oral agent (monotherapy). He is compliant with treatment most of the time. His weight is increasing steadily. He  is following a generally unhealthy diet. When asked about meal planning, he reported none. He participates in exercise intermittently. There is no change in his home blood glucose trend. (His point-of-care A1c of 5.7% consistent with controlled type 2 diabetes.    ) An ACE inhibitor/angiotensin II receptor blocker is not being taken.  Hyperlipidemia This is a chronic problem. The current episode started more than 1 year ago. Associated symptoms include myalgias. Pertinent negatives include no chest pain or shortness of breath. Current antihyperlipidemic treatment includes fibric acid derivatives and statins. Risk factors for coronary artery disease include dyslipidemia, diabetes mellitus, male sex, obesity, a sedentary lifestyle and family history.  Hypogonadism: He was diagnosed with hypogonadism at approximate age of 40, has been on testosterone supplement intermittently for the last  7 years.   He was supposed to be on 50 mg of testosterone gel daily, reports inconsistency using his testosterone.  He is previsit total testosterone is dropping to 169.    Pituitary microadenoma: First discovered in 2016 at which time was measuring 8 x 8 x 10 mm-a total of 1 of 640 mm3,   complete endocrine evaluation was negative.  Repeat MRI in 2028 showed measurements dropping to 8 x 7 x 9 mm-total volume of 504 mm3.   Review of Systems  Constitutional:  Negative for chills, fatigue, fever and unexpected weight change.  HENT:  Negative for dental problem, mouth sores and trouble swallowing.   Eyes:  Negative for visual disturbance.  Respiratory:  Negative for cough, choking, chest tightness, shortness of breath and wheezing.   Cardiovascular:  Negative for chest pain, palpitations and leg swelling.  Gastrointestinal:  Negative for abdominal distention, abdominal pain, constipation, diarrhea, nausea and vomiting.  Endocrine: Negative for polydipsia, polyphagia and polyuria.  Genitourinary:  Negative for dysuria,  flank pain, hematuria and urgency.  Musculoskeletal:  Positive for myalgias. Negative for back pain, gait problem and neck pain.  Skin:  Negative for pallor, rash and wound.  Neurological:  Negative for seizures, syncope, weakness, numbness and headaches.  Psychiatric/Behavioral:  Negative for confusion and dysphoric mood.    Objective:    Vitals with BMI 10/26/2020 06/22/2020 02/17/2020  Height 5' 6"  5' 6"  5' 6"   Weight 298 lbs 3 oz 280 lbs 13 oz 291 lbs  BMI 48.15 16.38 46.65  Systolic 993 570 177  Diastolic 78 78 74  Pulse 72 68 68    BP 126/78   Pulse 72   Ht 5' 6"  (1.676 m)   Wt 298 lb 3.2 oz (135.3 kg)   BMI 48.13 kg/m  Wt Readings from Last 3 Encounters:  10/26/20 298 lb 3.2 oz (135.3 kg)  06/22/20 280 lb 12.8 oz (127.4 kg)  02/17/20 291 lb (132 kg)     Physical Exam Constitutional:      General: He is not in acute distress.    Appearance: He is well-developed.  HENT:     Head: Normocephalic and atraumatic.  Neck:     Thyroid: No thyromegaly.     Trachea: No tracheal deviation.  Cardiovascular:     Rate and Rhythm: Normal rate.     Pulses:          Dorsalis pedis pulses are 1+ on the right side and 1+ on the left side.       Posterior tibial pulses are 1+ on the right side and 1+ on the left side.     Heart sounds: Normal heart sounds, S1 normal and S2 normal. No murmur heard.   No gallop.  Pulmonary:     Effort: No respiratory distress.     Breath sounds: Normal breath sounds. No wheezing.  Abdominal:     General: Bowel sounds are normal. There is no distension.     Palpations: Abdomen is soft.     Tenderness: There is no abdominal tenderness. There is no guarding.  Musculoskeletal:     Right shoulder: No swelling or deformity.     Cervical back: Normal range of motion and neck supple.  Skin:    General: Skin is warm and dry.     Findings: No rash.     Nails: There is no clubbing.     Comments: Genital exam is unremarkable with testicles 25 cc  bilaterally clinical exam is unremarkable instead of bursitis 25/CC bilaterally noscrotalmass.  No inguinal or femoral hernia.  Circumcised male.  Neurological:     Mental Status: He is alert and oriented to person, place, and time.     Cranial Nerves: No cranial nerve deficit.     Sensory: No sensory deficit.     Gait: Gait normal.     Deep Tendon Reflexes: Reflexes are normal and symmetric.  Psychiatric:        Speech: Speech normal.        Behavior: Behavior normal. Behavior is cooperative.        Thought Content: Thought content normal.        Judgment: Judgment normal.    Diabetic Labs (most recent): Lab Results  Component Value Date   HGBA1C 5.3 06/22/2020   HGBA1C 5.3 02/17/2020   HGBA1C 5.3 11/16/2019   Recent Results (from the past 2160 hour(s))  Comprehensive metabolic panel     Status: None   Collection Time: 10/12/20  8:00 AM  Result Value Ref Range   Glucose 97 70 - 99 mg/dL    Comment:               **Please note reference interval change**   BUN 15 6 - 24 mg/dL   Creatinine, Ser 0.79 0.76 - 1.27 mg/dL   eGFR 113 >59 mL/min/1.73   BUN/Creatinine Ratio 19 9 - 20   Sodium 142 134 - 144 mmol/L   Potassium 4.6 3.5 - 5.2 mmol/L   Chloride 106 96 - 106 mmol/L   CO2 20 20 - 29 mmol/L   Calcium 9.2 8.7 - 10.2 mg/dL   Total Protein 7.0 6.0 - 8.5 g/dL   Albumin 4.7 4.0 - 5.0 g/dL   Globulin, Total 2.3 1.5 - 4.5 g/dL   Albumin/Globulin  Ratio 2.0 1.2 - 2.2   Bilirubin Total 0.3 0.0 - 1.2 mg/dL   Alkaline Phosphatase 66 44 - 121 IU/L   AST 24 0 - 40 IU/L   ALT 39 0 - 44 IU/L  Lipid panel     Status: Abnormal   Collection Time: 10/12/20  8:00 AM  Result Value Ref Range   Cholesterol, Total 116 100 - 199 mg/dL   Triglycerides 81 0 - 149 mg/dL   HDL 37 (L) >39 mg/dL   VLDL Cholesterol Cal 16 5 - 40 mg/dL   LDL Chol Calc (NIH) 63 0 - 99 mg/dL   Chol/HDL Ratio 3.1 0.0 - 5.0 ratio    Comment:                                   T. Chol/HDL Ratio                                              Men  Women                               1/2 Avg.Risk  3.4    3.3                                   Avg.Risk  5.0    4.4                                2X Avg.Risk  9.6    7.1                                3X Avg.Risk 23.4   11.0   Testosterone, Free, Total, SHBG     Status: None   Collection Time: 10/12/20  8:00 AM  Result Value Ref Range   Testosterone 287 264 - 916 ng/dL    Comment: Adult male reference interval is based on a population of healthy nonobese males (BMI <30) between 77 and 47 years old. [place], [place] [id number]. PMID: [id number].    Testosterone, Free 7.5 6.8 - 21.5 pg/mL   Sex Hormone Binding 25.7 16.5 - 55.9 nmol/L  PSA     Status: None   Collection Time: 10/12/20  8:00 AM  Result Value Ref Range   Prostate Specific Ag, Serum 0.5 0.0 - 4.0 ng/mL    Comment: Roche ECLIA methodology. According to the American Urological Association, Serum PSA should decrease and remain at undetectable levels after radical prostatectomy. The AUA defines biochemical recurrence as an initial PSA value 0.2 ng/mL or greater followed by a subsequent confirmatory PSA value 0.2 ng/mL or greater. Values obtained with different assay methods or kits cannot be used interchangeably. Results cannot be interpreted as absolute evidence of the presence or absence of malignant disease.     Pituitary microadenoma 2018 MRI: 8 x 7 x 9 mm   Assessment & Plan:   1. Benign neoplasm of pituitary gland (HCC) -2. Diabetes mellitus without complication ([place]) 3. Mixed hyperlipidemia 4. Hypogonadism, male   1)  Regarding his pituitary microadenoma:  His repeat endocrine work-up is consistent with nonfunctioning pituitary adenoma.  He will not need pituitary imaging or intervention at this time    2) currently controlled type 2 diabetes, point-of-care A1c remains stable at 5.7%.  He does not monitor blood glucose, denies hypoglycemia.  He will continue to benefit from  low-dose metformin.  He is advised to continue metformin 500 mg p.o. daily after breakfast.     He does not report gross complications from diabetes, however   he remains at a high risk for more acute and chronic complications which include CAD, CVA, CKD, retinopathy, and neuropathy. These are all discussed in detail with him.  - I have counseled him on diet  and weight management  by adopting a carbohydrate restricted/protein rich diet. Patient is encouraged to switch to  unprocessed or minimally processed     complex starch and increased protein intake (animal or plant source), fruits, and vegetables.  Is educated on plant predominant, whole foods lifestyle nutrition. -  he is advised to stick to a routine mealtimes to eat 3 meals  a day and avoid unnecessary snacks ( to snack only to correct hypoglycemia).   - he acknowledges that there is a room for improvement in his food and drink choices. - Suggestion is made for him to avoid simple carbohydrates  from his diet including Cakes, Sweet Desserts, Ice Cream, Soda (diet and regular), Sweet Tea, Candies, Chips, Cookies, Store Bought Juices, Alcohol in Excess of  1-2 drinks a day, Artificial Sweeteners,  Coffee Creamer, and "Sugar-free" Products, Lemonade. This will help patient to have more stable blood glucose profile and potentially avoid unintended weight gain.  -Detailed discussion on plant-based whole food lifestyle nutrition and recommended to him. -His insurance did not pay for Saxenda.  His bariatric surgery process is stalled. -He will try with [facility] in [facility].   - Specific targets for  A1c;  LDL, HDL,  and Triglycerides were discussed with the patient.  3) Blood Pressure /Hypertension:  His blood pressure is controlled.  He is not on antihypertensive medications.    4) Lipids/Hyperlipidemia: His recent lipid panel showed LDL improved to 63 from 116.  He is tolerating and advised to continue  Crestor 10 mg p.o. nightly along with Tricor 48 mg p.o. daily.   PBW F diet discussed above will help with dyslipidemia as well.   Side effects and precautions discussed with him.     5)  Weight/Diet:  Body mass index is 48.13 kg/m.  -   clearly complicating his diabetes care.  Diet and exercise emphasized as above.  he is  A good  candidate for weight loss.  See above, information given about [facility].    6) hypogonadism: I have reviewed his current and previous total testosterone measurements.  He did have testosterone as low as 66 in 2017.  His previsit total testosterone improving to 287 .  He is advised to continue testosterone gel  20.25 mg per ACT on each shoulder every morning for a total of 40.5 mg daily.       His genital exam is unremarkable including normal sized testicles.       He will have total testosterone, CBC during his next visit.  His CMP and PSA are normal at this time.  7) Chronic Care/Health Maintenance:  -he  is on not ACEI nor  Statin medications and  is encouraged to initiate and continue to follow up with Ophthalmology, Dentist,  Podiatrist at least yearly or according to recommendations, and advised to   stay away from smoking. I have recommended yearly flu vaccine and pneumonia vaccine at least every 5 years; moderate intensity exercise for up to 150 minutes weekly; and  sleep for at least 7 hours a day.  -He is advised to resume vitamin D3 5000 units daily for the next 6 months.     - he is  advised to maintain close follow up with his PCP for primary care needs, as well as his other providers for optimal and coordinated care.    I spent 33 minutes in the care of the patient today including review of labs from Thyroid Function, CMP, and other relevant labs ; imaging/biopsy records (current and previous including abstractions from other facilities); face-to-face time discussing  his lab results and symptoms, medications doses, his options of  short and long term treatment based on the latest standards of care / guidelines;   and documenting the encounter.  Doreene Burke  participated in the discussions, expressed understanding, and voiced agreement with the above plans.  All questions were answered to his satisfaction. he is encouraged to contact clinic should he have any questions or concerns prior to his return visit.   Follow up plan: - Return in about 4 months (around 02/26/2021) for Fasting Labs  in AM B4 8.  Glade Lloyd, MD [facility] [address] [place], [place] [address] Phone: [phone]  Fax: [phone]    10/26/2020, 8:57 AM  This note was partially dictated with voice recognition software. Similar sounding words can be transcribed inadequately or may not  be corrected upon review.

## 2020-11-21 ENCOUNTER — Other Ambulatory Visit: Payer: Self-pay | Admitting: "Endocrinology

## 2020-11-21 NOTE — Telephone Encounter (Signed)
Controlled Substance  Duplicate request.  I am not able to delete this request.

## 2020-11-21 NOTE — Telephone Encounter (Signed)
Controlled Substance  Last OV 10/26/2020  Next OV 03/01/2021  Last filled 10/08/2020 75 g with 0 refills

## 2020-12-17 ENCOUNTER — Other Ambulatory Visit (HOSPITAL_COMMUNITY): Payer: Self-pay | Admitting: Internal Medicine

## 2020-12-17 ENCOUNTER — Ambulatory Visit (HOSPITAL_COMMUNITY)
Admission: RE | Admit: 2020-12-17 | Discharge: 2020-12-17 | Disposition: A | Discharge status: Home / Self Care | Source: Ambulatory Visit | Attending: Internal Medicine | Admitting: Internal Medicine

## 2020-12-17 ENCOUNTER — Other Ambulatory Visit: Payer: Self-pay

## 2020-12-17 DIAGNOSIS — R059 Cough, unspecified: Secondary | ICD-10-CM

## 2021-02-12 ENCOUNTER — Other Ambulatory Visit: Payer: Self-pay | Admitting: "Endocrinology

## 2021-02-13 ENCOUNTER — Other Ambulatory Visit: Payer: Self-pay | Admitting: "Endocrinology

## 2021-02-25 ENCOUNTER — Other Ambulatory Visit: Payer: Self-pay | Admitting: "Endocrinology

## 2021-03-01 ENCOUNTER — Ambulatory Visit: Admitting: "Endocrinology

## 2021-03-24 ENCOUNTER — Other Ambulatory Visit: Payer: Self-pay | Admitting: "Endocrinology

## 2021-04-17 ENCOUNTER — Other Ambulatory Visit: Payer: Self-pay | Admitting: Psychiatry

## 2021-04-17 NOTE — Telephone Encounter (Signed)
Declined refill of lamotrigine refill this time as it is unclear when the patient took this medication the last time. Please contact the patient and ask the last time he took lamotrigine/dose. Please also advice to have a follow up appointment.  ?

## 2021-05-10 ENCOUNTER — Other Ambulatory Visit: Payer: Self-pay | Admitting: "Endocrinology

## 2021-08-08 ENCOUNTER — Other Ambulatory Visit: Payer: Self-pay | Admitting: "Endocrinology

## 2021-09-07 ENCOUNTER — Other Ambulatory Visit: Payer: Self-pay | Admitting: "Endocrinology

## 2022-08-29 ENCOUNTER — Telehealth: Payer: Self-pay

## 2022-08-29 NOTE — Telephone Encounter (Signed)
Tried to return call from [place] with [facility], had to leave a message requesting she return call to the office.

## 2022-09-22 IMAGING — DX DG CHEST 2V
2 series · 2 of 2 positions shown · non-contrast
Comparison: X-ray chest 01/15/2019.

CLINICAL DATA: Cough with hemoptysis and notes burning sensation to
mid chest area. History of pulmonary nodular amyloidosis.

EXAM:
CHEST - 2 VIEW

[chest pa]
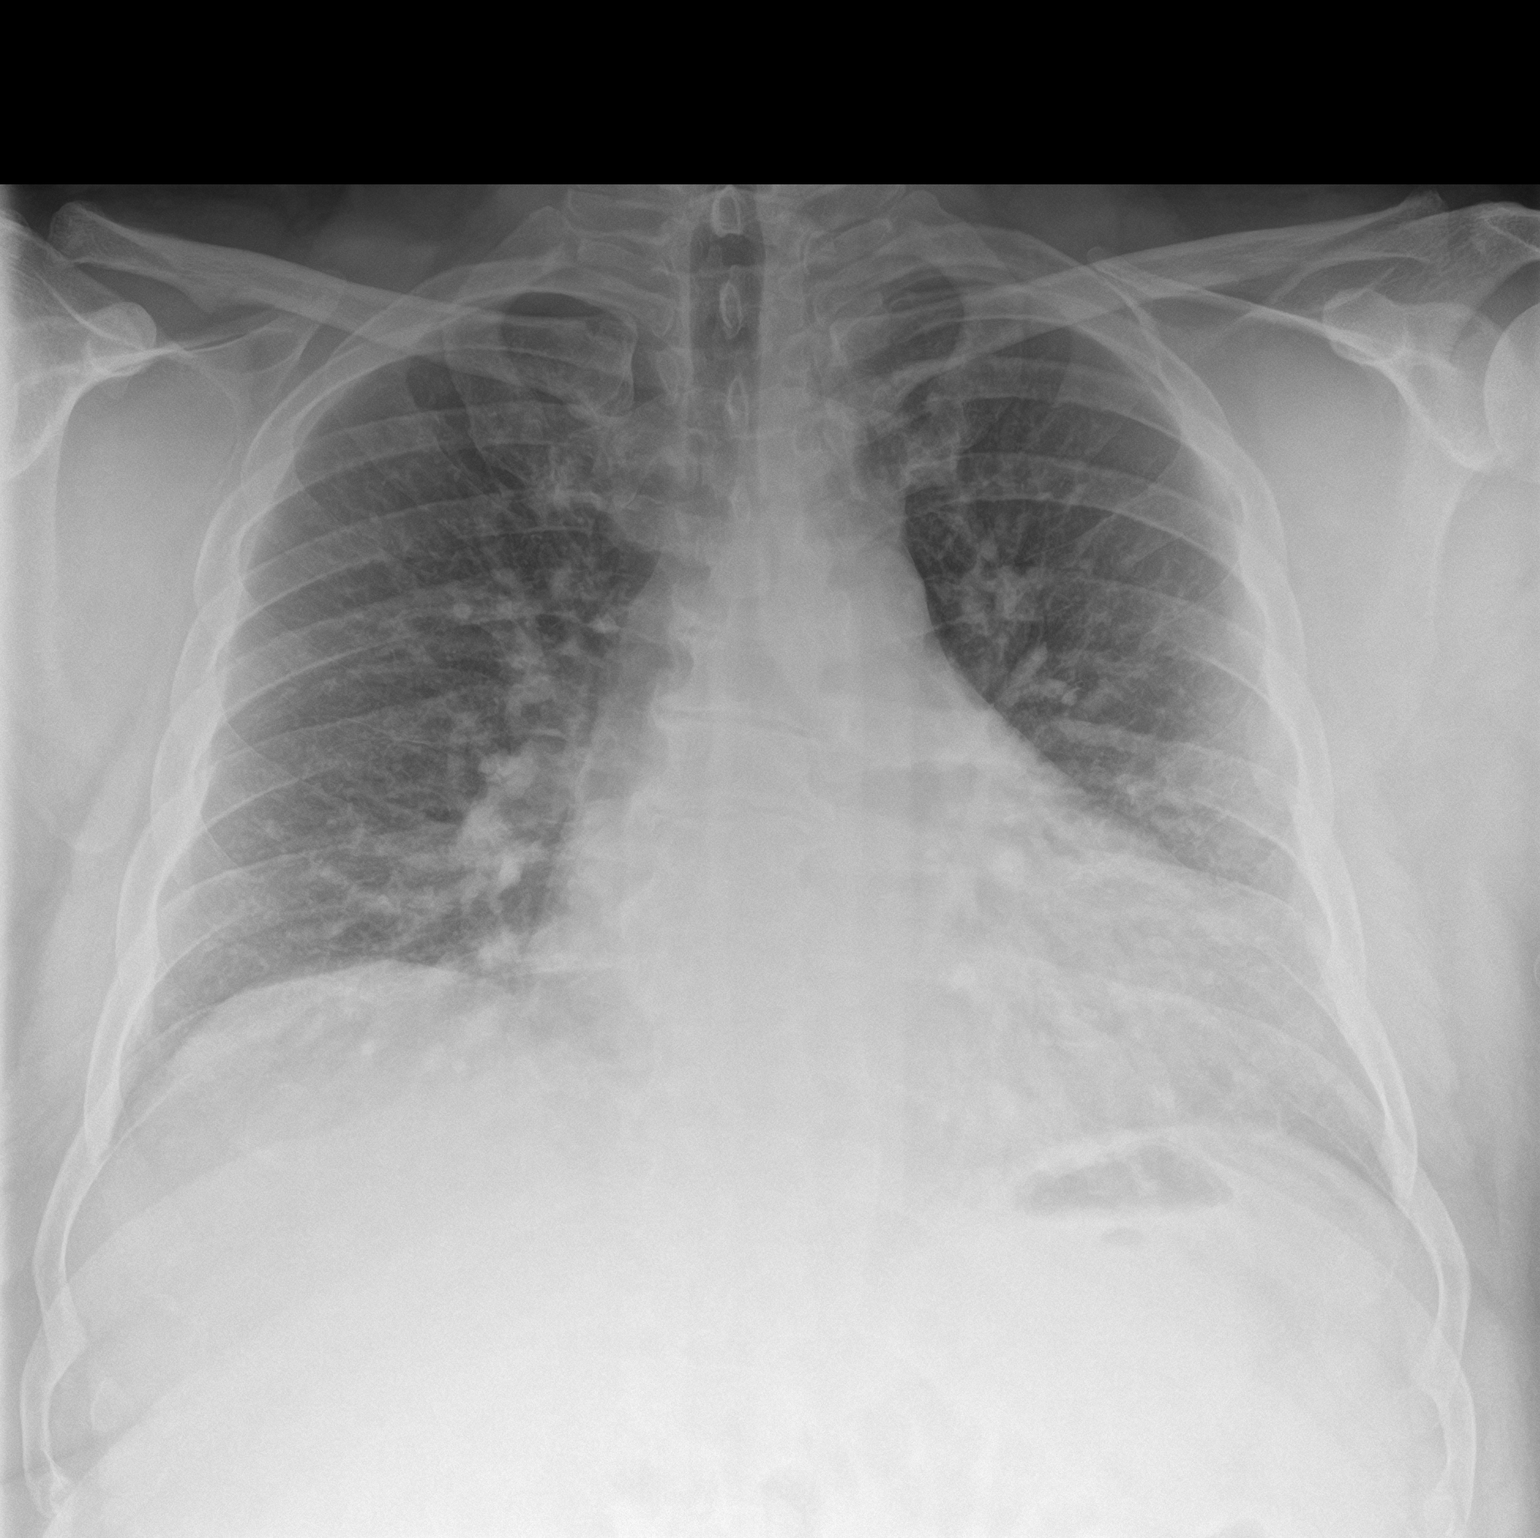

[chest lat]
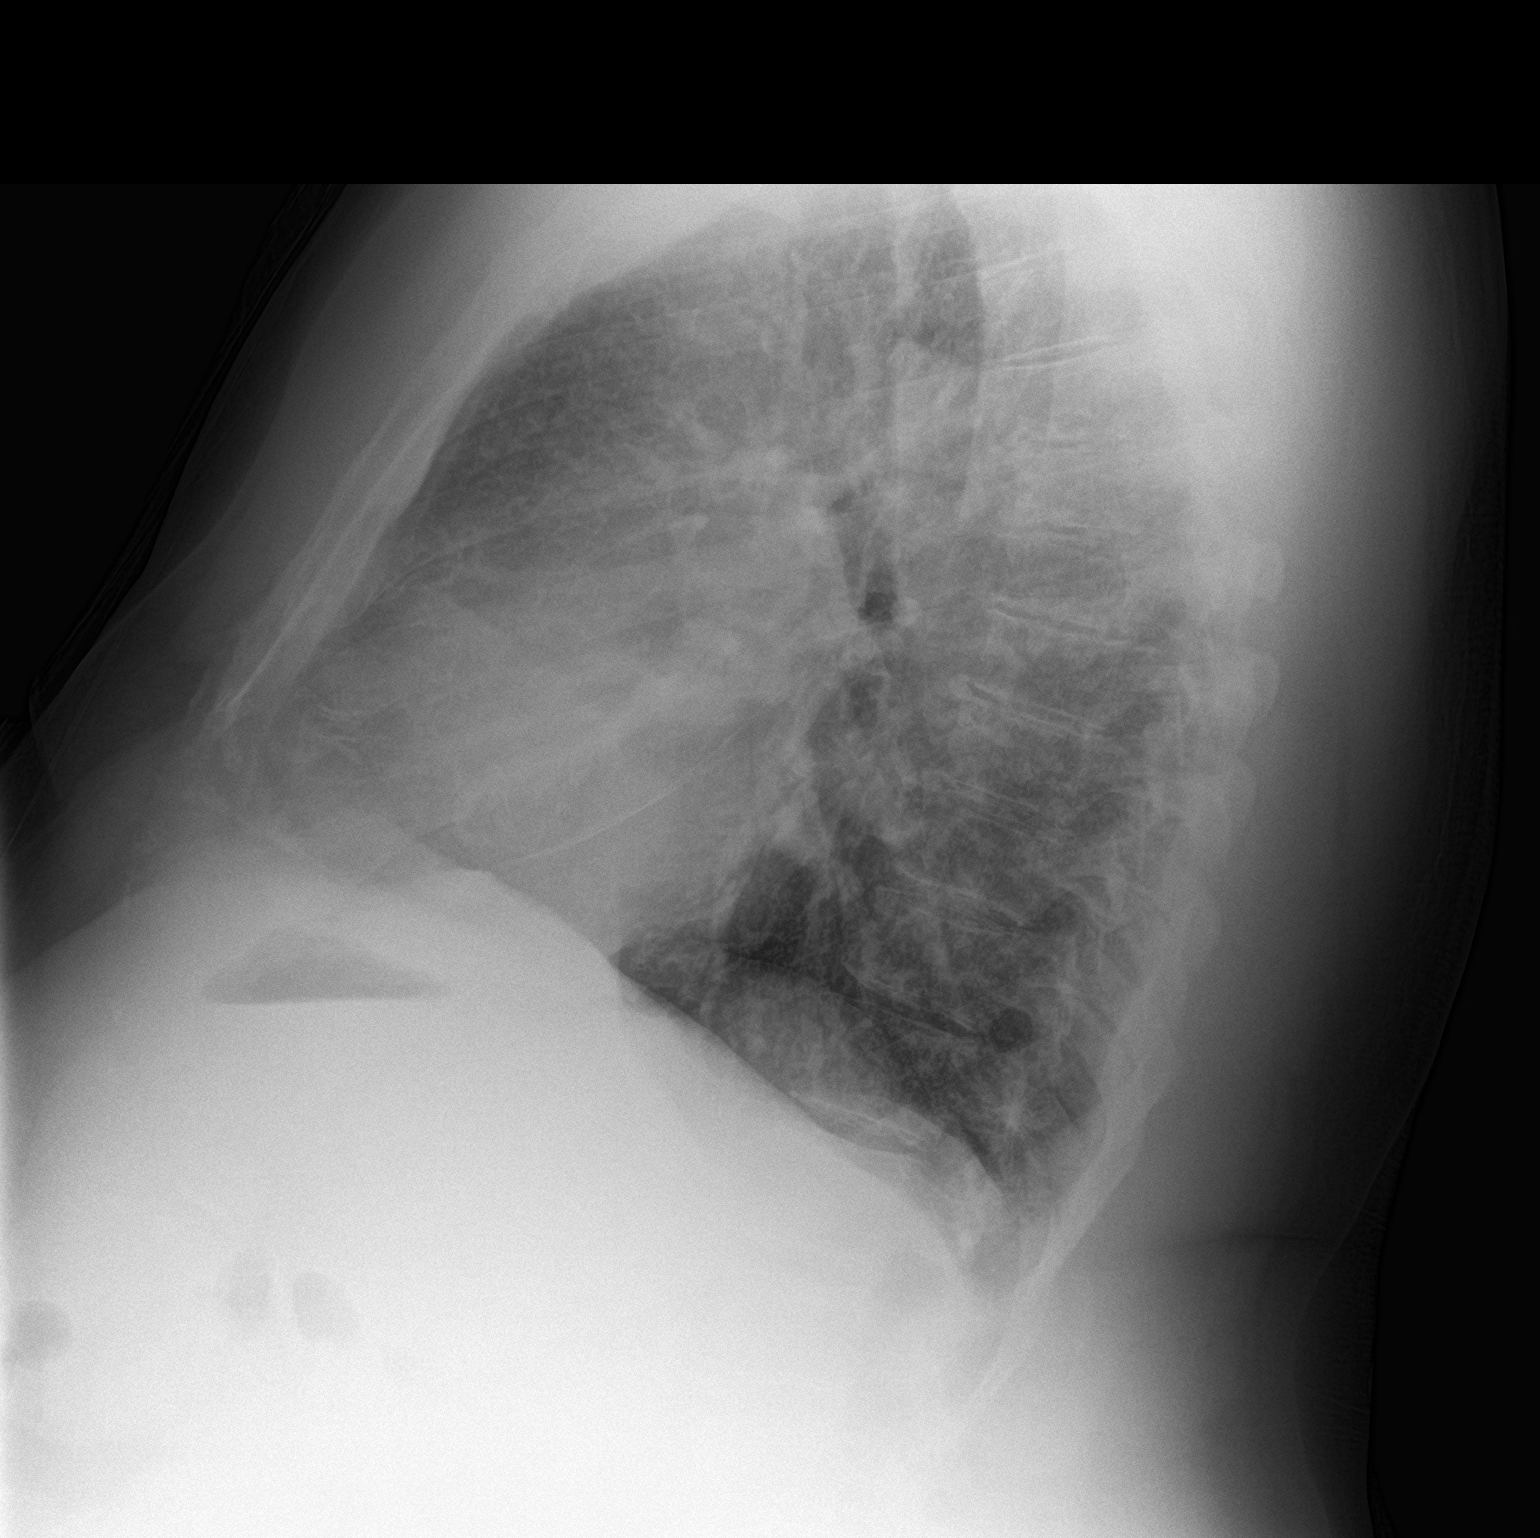

[2 of 2 positions shown; findings below may reference images not displayed]

FINDINGS: Mild bilateral interstitial thickening. No focal consolidation. No
pleural effusion or pneumothorax. Stable cardiomegaly.

No acute osseous abnormality.
IMPRESSION: Mild bilateral interstitial thickening which may reflect
interstitial edema or infection including atypical viral infection.
